# Patient Record
Sex: Male | Born: 1945 | Race: Black or African American | Hispanic: No | Marital: Married | Smoking: Former smoker
Health system: Southern US, Community
[De-identification: ages and names within clinical notes are randomized; demographics above are authoritative.]

## PROBLEM LIST (undated history)

## (undated) DIAGNOSIS — N400 Enlarged prostate without lower urinary tract symptoms: Secondary | ICD-10-CM

## (undated) DIAGNOSIS — I10 Essential (primary) hypertension: Secondary | ICD-10-CM

## (undated) DIAGNOSIS — C801 Malignant (primary) neoplasm, unspecified: Secondary | ICD-10-CM

## (undated) DIAGNOSIS — F17200 Nicotine dependence, unspecified, uncomplicated: Secondary | ICD-10-CM

## (undated) DIAGNOSIS — M1990 Unspecified osteoarthritis, unspecified site: Secondary | ICD-10-CM

## (undated) HISTORY — DX: Benign prostatic hyperplasia without lower urinary tract symptoms: N40.0

## (undated) HISTORY — PX: ORIF ELBOW FRACTURE: SUR928

## (undated) HISTORY — DX: Nicotine dependence, unspecified, uncomplicated: F17.200

## (undated) HISTORY — PX: OTHER SURGICAL HISTORY: SHX169

---

## 2002-05-23 ENCOUNTER — Emergency Department (HOSPITAL_COMMUNITY): Admission: EM | Admit: 2002-05-23 | Discharge: 2002-05-23 | Payer: Self-pay | Admitting: Emergency Medicine

## 2002-05-23 ENCOUNTER — Encounter: Payer: Self-pay | Admitting: Emergency Medicine

## 2003-09-28 ENCOUNTER — Emergency Department (HOSPITAL_COMMUNITY): Admission: EM | Admit: 2003-09-28 | Discharge: 2003-09-28 | Payer: Self-pay | Admitting: *Deleted

## 2010-01-14 ENCOUNTER — Ambulatory Visit (HOSPITAL_COMMUNITY)
Admission: RE | Admit: 2010-01-14 | Discharge: 2010-01-14 | Payer: Self-pay | Source: Home / Self Care | Admitting: Family Medicine

## 2010-03-11 ENCOUNTER — Ambulatory Visit: Admit: 2010-03-11 | Payer: Self-pay | Admitting: Internal Medicine

## 2010-04-25 LAB — COMPREHENSIVE METABOLIC PANEL
Albumin: 4.2 g/dL (ref 3.5–5.2)
Alkaline Phosphatase: 81 U/L (ref 39–117)
BUN: 8 mg/dL (ref 6–23)
Chloride: 106 mEq/L (ref 96–112)
Creatinine, Ser: 1.13 mg/dL (ref 0.4–1.5)
Glucose, Bld: 92 mg/dL (ref 70–99)
Total Bilirubin: 1.2 mg/dL (ref 0.3–1.2)
Total Protein: 7.3 g/dL (ref 6.0–8.3)

## 2010-04-25 LAB — CBC
HCT: 42.7 % (ref 39.0–52.0)
MCH: 27.9 pg (ref 26.0–34.0)
MCV: 84.6 fL (ref 78.0–100.0)
Platelets: 186 10*3/uL (ref 150–400)
RDW: 13.5 % (ref 11.5–15.5)

## 2010-04-25 LAB — LIPID PANEL: VLDL: 49 mg/dL — ABNORMAL HIGH (ref 0–40)

## 2010-04-25 LAB — TSH: TSH: 0.769 u[IU]/mL (ref 0.350–4.500)

## 2010-04-25 LAB — PSA: PSA: 1.77 ng/mL (ref <=4.00)

## 2011-11-17 ENCOUNTER — Ambulatory Visit (HOSPITAL_COMMUNITY)
Admission: RE | Admit: 2011-11-17 | Discharge: 2011-11-17 | Disposition: A | Discharge status: Home / Self Care | Payer: Medicare Other | Source: Ambulatory Visit | Attending: Family Medicine | Admitting: Family Medicine

## 2011-11-17 ENCOUNTER — Other Ambulatory Visit (HOSPITAL_COMMUNITY): Payer: Self-pay | Admitting: Family Medicine

## 2011-11-17 ENCOUNTER — Ambulatory Visit (HOSPITAL_COMMUNITY): Payer: Self-pay

## 2011-11-17 DIAGNOSIS — I498 Other specified cardiac arrhythmias: Secondary | ICD-10-CM | POA: Insufficient documentation

## 2011-11-17 DIAGNOSIS — I10 Essential (primary) hypertension: Secondary | ICD-10-CM | POA: Insufficient documentation

## 2011-12-15 ENCOUNTER — Other Ambulatory Visit: Payer: Self-pay | Admitting: Urology

## 2011-12-19 ENCOUNTER — Encounter (HOSPITAL_BASED_OUTPATIENT_CLINIC_OR_DEPARTMENT_OTHER): Payer: Self-pay | Admitting: *Deleted

## 2011-12-19 NOTE — Progress Notes (Signed)
Pt instructed npo p mn 11/7 .  To [facility] 11/8 @ 1015.  Needs istat on arrival.  Ekg,cxr in epic

## 2011-12-21 NOTE — H&P (Signed)
History of Present Illness  Joseph Hunt is seen at Dr Britt Bottom Blount's request.  He has noticed an increasing swelling of the left groin for the past 6-9 months.  The swelling is not associated with any pain.  He has some scrotal discomfort.  He has frequency, nocturia, difficulty achieving full erections. He also would like to be circumcised.  He has been having difficulty retracting his foreskin for hygiene purposes.  Past Medical History Problems  1. History of  Atrial Fibrillation 427.31 2. History of  Esophageal Reflux 530.81 3. History of  Gastric Ulcer 531.90 4. History of  Hypercholesterolemia 272.0 5. History of  Hypertension 401.9 6. History of  Thrombophlebitis Of Deep Vessels Of The Lower Extremity V12.51 7. History of  Transient Ischemic Attack 435.9  Surgical History Problems  1. History of  Leg Repair  Current Meds 1. AmLODIPine Besylate 10 MG Oral Tablet; Therapy: (Recorded:31Oct2013) to 2. CloNIDine HCl 0.1 MG Oral Tablet; Therapy: (Recorded:31Oct2013) to  Allergies Medication  1. No Known Drug Allergies  Family History Problems  1. Maternal history of  Diabetes Mellitus V18.0 2. Family history of  Family Health Status Number Of Children 3. Family history of  Father Deceased At Age 75 4. Family history of  Mother Deceased At Age 56 5. Paternal history of  Prostate Cancer V16.42 6. Maternal history of  Renal Failure  Social History Problems    Caffeine Use   Marital History - Currently Married   Occupation:   Smoking Cigarettes 305.1 Denied    History of  Alcohol Use  Review of Systems Genitourinary, constitutional, skin, eye, otolaryngeal, hematologic/lymphatic, cardiovascular, pulmonary, endocrine, musculoskeletal, gastrointestinal, neurological and psychiatric system(s) were reviewed and pertinent findings if present are noted.  Genitourinary: urinary frequency, nocturia and erectile dysfunction.  Gastrointestinal: heartburn.  Constitutional:  recent weight loss.  Musculoskeletal: back pain and joint pain.    Vitals Vital Signs [Data Includes: Last 1 Day]  31Oct2013 12:59PM  BMI Calculated: 28.15 BSA Calculated: 2.24 Height: 6 ft 1.5 in Weight: 217 lb  Blood Pressure: 151 / 87 Heart Rate: 71  Physical Exam Constitutional: Well nourished and well developed . No acute distress.  ENT:. The ears and nose are normal in appearance.  Neck: The appearance of the neck is normal and no neck mass is present.  Pulmonary: No respiratory distress and normal respiratory rhythm and effort.  Cardiovascular: Heart rate and rhythm are normal . No peripheral edema.  Abdomen: The abdomen is soft and nontender. No masses are palpated. No CVA tenderness. No hernias are palpable. No hepatosplenomegaly noted.  Rectal: Rectal exam demonstrates normal sphincter tone, no tenderness and no masses. Estimated prostate size is 2+. The prostate has no nodularity and is not tender. The left seminal vesicle is nonpalpable. The right seminal vesicle is nonpalpable. The perineum is normal on inspection.  Genitourinary: Examination of the penis demonstrates no discharge, no masses, no lesions and a normal meatus. The penis is uncircumcised. The scrotum is edematous, but without lesions. Examination of the left scrotum demostrates a hydrocele. The right epididymis is palpably normal and non-tender. The left epididymis is palpably normal and non-tender. The right testis is palpably normal, non-tender and without masses. The left testis is nonpalpable, non-tender and without masses.  Lymphatics: The femoral and inguinal nodes are not enlarged or tender.  Skin: Normal skin turgor, no visible rash and no visible skin lesions.  Neuro/Psych:. Mood and affect are appropriate.    Results/Data Urine [Data Includes: Last 1 Day]  31Oct2013  COLOR YELLOW   APPEARANCE CLEAR   SPECIFIC GRAVITY 1.010   pH 7.0   GLUCOSE NEG mg/dL  BILIRUBIN NEG   KETONE NEG mg/dL  BLOOD  NEG   PROTEIN NEG mg/dL  UROBILINOGEN 0.2 mg/dL  NITRITE NEG   LEUKOCYTE ESTERASE NEG     SCROTAL ULTRASOUND INDICATION: Swelling left scrotum The right testis measures 3.9 x 1.7 x 2.2 cm The left testis measures 3.9 x 2.2 x 2.4 cm. There is a large left hydrocele that measures 10.1 x 8.6 x 6.5 cm. There is good flow to both testes. IMPRESSION: Large left hydrocele.   Assessment Assessed  1. Hydrocele Left 603.9  Plan Health Maintenance (V70.0)  1. UA With REFLEX  Done: 31Oct2013 12:37PM Hydrocele Left (603.9)  2. SCROTAL U/S  Done: 31Oct2013 12:00AM      Scrotal ultrasound. I discussed treatment options with the patient: conservative management versus excision. He states that the swelling has been bothering him and he would like to have it out.  The risks of the procedure were explained to him and include but are not limited to hemorrhage, infection, recurrence, hematoma, injury to adjacent structures. He understands and wishes to proceed.  Circumcision.  Signatures  CC: Dr Clyda Greener  Electronically signed by : Su Grand, M.D.; Dec 14 2011  3:02PM

## 2011-12-22 ENCOUNTER — Ambulatory Visit (HOSPITAL_BASED_OUTPATIENT_CLINIC_OR_DEPARTMENT_OTHER)
Admission: RE | Admit: 2011-12-22 | Discharge: 2011-12-22 | Disposition: A | Discharge status: Home / Self Care | Payer: Medicare Other | Source: Ambulatory Visit | Attending: Urology | Admitting: Urology

## 2011-12-22 ENCOUNTER — Encounter (HOSPITAL_BASED_OUTPATIENT_CLINIC_OR_DEPARTMENT_OTHER): Payer: Self-pay

## 2011-12-22 ENCOUNTER — Encounter (HOSPITAL_BASED_OUTPATIENT_CLINIC_OR_DEPARTMENT_OTHER): Payer: Self-pay | Admitting: Anesthesiology

## 2011-12-22 ENCOUNTER — Ambulatory Visit (HOSPITAL_BASED_OUTPATIENT_CLINIC_OR_DEPARTMENT_OTHER): Payer: Medicare Other | Admitting: Anesthesiology

## 2011-12-22 ENCOUNTER — Encounter (HOSPITAL_BASED_OUTPATIENT_CLINIC_OR_DEPARTMENT_OTHER)
Admission: RE | Disposition: A | Discharge status: Home / Self Care | Payer: Self-pay | Source: Ambulatory Visit | Attending: Urology

## 2011-12-22 DIAGNOSIS — Z8673 Personal history of transient ischemic attack (TIA), and cerebral infarction without residual deficits: Secondary | ICD-10-CM | POA: Insufficient documentation

## 2011-12-22 DIAGNOSIS — N478 Other disorders of prepuce: Secondary | ICD-10-CM | POA: Insufficient documentation

## 2011-12-22 DIAGNOSIS — I10 Essential (primary) hypertension: Secondary | ICD-10-CM | POA: Insufficient documentation

## 2011-12-22 DIAGNOSIS — N433 Hydrocele, unspecified: Secondary | ICD-10-CM | POA: Insufficient documentation

## 2011-12-22 DIAGNOSIS — K219 Gastro-esophageal reflux disease without esophagitis: Secondary | ICD-10-CM | POA: Insufficient documentation

## 2011-12-22 DIAGNOSIS — E7800 Pure hypercholesterolemia, unspecified: Secondary | ICD-10-CM | POA: Insufficient documentation

## 2011-12-22 DIAGNOSIS — N471 Phimosis: Secondary | ICD-10-CM | POA: Insufficient documentation

## 2011-12-22 HISTORY — DX: Essential (primary) hypertension: I10

## 2011-12-22 HISTORY — DX: Unspecified osteoarthritis, unspecified site: M19.90

## 2011-12-22 HISTORY — PX: CIRCUMCISION: SHX1350

## 2011-12-22 HISTORY — PX: HYDROCELE EXCISION: SHX482

## 2011-12-22 LAB — POCT I-STAT 4, (NA,K, GLUC, HGB,HCT)
Hemoglobin: 14.6 g/dL (ref 13.0–17.0)
Sodium: 143 mEq/L (ref 135–145)

## 2011-12-22 SURGERY — HYDROCELECTOMY
Anesthesia: General | Site: Scrotum | Wound class: Clean

## 2011-12-22 MED ORDER — CEFAZOLIN SODIUM-DEXTROSE 2-3 GM-% IV SOLR
2.0000 g | INTRAVENOUS | Status: AC
  Administered 2011-12-22: 2 g via INTRAVENOUS
  Filled 2011-12-22: qty 50

## 2011-12-22 MED ORDER — LIDOCAINE HCL 4 % MT SOLN
OROMUCOSAL | Status: DC | PRN
  Administered 2011-12-22: 4 mL via TOPICAL

## 2011-12-22 MED ORDER — HYDROCODONE-ACETAMINOPHEN 5-500 MG PO CAPS: 1.0000 | ORAL_CAPSULE | Freq: Four times a day (QID) | ORAL | Status: DC | PRN

## 2011-12-22 MED ORDER — EPHEDRINE SULFATE 50 MG/ML IJ SOLN
INTRAMUSCULAR | Status: DC | PRN
  Administered 2011-12-22: 25 mg via INTRAVENOUS

## 2011-12-22 MED ORDER — ONDANSETRON HCL 4 MG/2ML IJ SOLN
INTRAMUSCULAR | Status: DC | PRN
  Administered 2011-12-22: 4 mg via INTRAVENOUS

## 2011-12-22 MED ORDER — LIDOCAINE HCL (CARDIAC) 20 MG/ML IV SOLN
INTRAVENOUS | Status: DC | PRN
  Administered 2011-12-22: 100 mg via INTRAVENOUS

## 2011-12-22 MED ORDER — MINERAL OIL LIGHT 100 % EX OIL: TOPICAL_OIL | CUTANEOUS | Status: DC | PRN

## 2011-12-22 MED ORDER — HYDROCODONE-ACETAMINOPHEN 5-325 MG PO TABS
1.0000 | ORAL_TABLET | Freq: Four times a day (QID) | ORAL | Status: DC | PRN
  Filled 2011-12-22: qty 1

## 2011-12-22 MED ORDER — LACTATED RINGERS IV SOLN
INTRAVENOUS | Status: DC
  Administered 2011-12-22 (×2): via INTRAVENOUS
  Filled 2011-12-22: qty 1000

## 2011-12-22 MED ORDER — MIDAZOLAM HCL 5 MG/5ML IJ SOLN
INTRAMUSCULAR | Status: DC | PRN
  Administered 2011-12-22: 1 mg via INTRAVENOUS

## 2011-12-22 MED ORDER — SUCCINYLCHOLINE CHLORIDE 20 MG/ML IJ SOLN
INTRAMUSCULAR | Status: DC | PRN
  Administered 2011-12-22: 140 mg via INTRAVENOUS

## 2011-12-22 MED ORDER — PROMETHAZINE HCL 25 MG/ML IJ SOLN
6.2500 mg | INTRAMUSCULAR | Status: DC | PRN
  Filled 2011-12-22: qty 1

## 2011-12-22 MED ORDER — COLLODION LIQD
Status: DC | PRN
  Administered 2011-12-22: 1 via TOPICAL

## 2011-12-22 MED ORDER — CEPHALEXIN 250 MG PO CAPS: 250.0000 mg | ORAL_CAPSULE | Freq: Four times a day (QID) | ORAL | Status: DC

## 2011-12-22 MED ORDER — BUPIVACAINE HCL (PF) 0.25 % IJ SOLN
INTRAMUSCULAR | Status: DC | PRN
  Administered 2011-12-22: 17 mL

## 2011-12-22 MED ORDER — BACITRACIN-NEOMYCIN-POLYMYXIN 400-5-5000 EX OINT
TOPICAL_OINTMENT | CUTANEOUS | Status: DC | PRN
  Administered 2011-12-22: 1 via TOPICAL

## 2011-12-22 MED ORDER — GLYCOPYRROLATE 0.2 MG/ML IJ SOLN
INTRAMUSCULAR | Status: DC | PRN
  Administered 2011-12-22: 0.2 mg via INTRAVENOUS

## 2011-12-22 MED ORDER — HYDROCODONE-ACETAMINOPHEN 5-325 MG PO TABS
1.0000 | ORAL_TABLET | Freq: Four times a day (QID) | ORAL | Status: DC | PRN
  Administered 2011-12-22: 1 via ORAL
  Filled 2011-12-22: qty 1

## 2011-12-22 MED ORDER — CEFAZOLIN SODIUM 1-5 GM-% IV SOLN
1.0000 g | INTRAVENOUS | Status: DC
  Filled 2011-12-22: qty 50

## 2011-12-22 MED ORDER — FENTANYL CITRATE 0.05 MG/ML IJ SOLN
25.0000 ug | INTRAMUSCULAR | Status: DC | PRN
  Filled 2011-12-22: qty 1

## 2011-12-22 MED ORDER — FENTANYL CITRATE 0.05 MG/ML IJ SOLN
INTRAMUSCULAR | Status: DC | PRN
  Administered 2011-12-22 (×2): 50 ug via INTRAVENOUS

## 2011-12-22 MED ORDER — DEXAMETHASONE SODIUM PHOSPHATE 4 MG/ML IJ SOLN
INTRAMUSCULAR | Status: DC | PRN
  Administered 2011-12-22: 8 mg via INTRAVENOUS

## 2011-12-22 MED ORDER — PROPOFOL 10 MG/ML IV BOLUS
INTRAVENOUS | Status: DC | PRN
  Administered 2011-12-22: 80 mg via INTRAVENOUS
  Administered 2011-12-22: 200 mg via INTRAVENOUS

## 2011-12-22 SURGICAL SUPPLY — 55 items
APL SKNCLS STERI-STRIP NONHPOA (GAUZE/BANDAGES/DRESSINGS)
APPLICATOR COTTON TIP 6IN STRL (MISCELLANEOUS) ×3 IMPLANT
BANDAGE CO FLEX L/F 1IN X 5YD (GAUZE/BANDAGES/DRESSINGS) IMPLANT
BANDAGE COBAN STERILE 2 (GAUZE/BANDAGES/DRESSINGS) ×1 IMPLANT
BANDAGE CONFORM 2  STR LF (GAUZE/BANDAGES/DRESSINGS) ×1 IMPLANT
BANDAGE GAUZE ELAST BULKY 4 IN (GAUZE/BANDAGES/DRESSINGS) ×3 IMPLANT
BENZOIN TINCTURE PRP APPL 2/3 (GAUZE/BANDAGES/DRESSINGS) IMPLANT
BLADE SURG 15 STRL LF DISP TIS (BLADE) ×2 IMPLANT
BLADE SURG 15 STRL SS (BLADE) ×3
BLADE SURG ROTATE 9660 (MISCELLANEOUS) ×3 IMPLANT
CANISTER SUCTION 1200CC (MISCELLANEOUS) IMPLANT
CANISTER SUCTION 2500CC (MISCELLANEOUS) ×1 IMPLANT
CLOTH BEACON ORANGE TIMEOUT ST (SAFETY) ×3 IMPLANT
COVER MAYO STAND STRL (DRAPES) ×3 IMPLANT
COVER TABLE BACK 60X90 (DRAPES) ×3 IMPLANT
DRAPE PED LAPAROTOMY (DRAPES) ×3 IMPLANT
ELECT NDL TIP 2.8 STRL (NEEDLE) ×2 IMPLANT
ELECT NEEDLE TIP 2.8 STRL (NEEDLE) ×3 IMPLANT
ELECT REM PT RETURN 9FT ADLT (ELECTROSURGICAL) ×3
ELECTRODE REM PT RTRN 9FT ADLT (ELECTROSURGICAL) ×2 IMPLANT
GAUZE VASELINE 1X8 (GAUZE/BANDAGES/DRESSINGS) ×1 IMPLANT
GLOVE BIO SURGEON STRL SZ 6.5 (GLOVE) ×1 IMPLANT
GLOVE BIO SURGEON STRL SZ7 (GLOVE) ×3 IMPLANT
GLOVE BIO SURGEON STRL SZ7.5 (GLOVE) ×1 IMPLANT
GLOVE BIOGEL PI IND STRL 7.0 (GLOVE) IMPLANT
GLOVE BIOGEL PI INDICATOR 7.0 (GLOVE) ×1
GLOVE ECLIPSE 7.0 STRL STRAW (GLOVE) ×1 IMPLANT
GOWN PREVENTION PLUS LG XLONG (DISPOSABLE) ×2 IMPLANT
GOWN PREVENTION PLUS XLARGE (GOWN DISPOSABLE) ×1 IMPLANT
GOWN STRL NON-REIN LRG LVL3 (GOWN DISPOSABLE) ×1 IMPLANT
GOWN W/2 COTTON TOWELS 2 STD (GOWNS) ×2 IMPLANT
MARKER SKIN DUAL TIP RULER LAB (MISCELLANEOUS) ×3 IMPLANT
NDL HYPO 25X1 1.5 SAFETY (NEEDLE) ×2 IMPLANT
NEEDLE HYPO 25X1 1.5 SAFETY (NEEDLE) ×3 IMPLANT
NS IRRIG 500ML POUR BTL (IV SOLUTION) ×3 IMPLANT
PACK BASIN DAY SURGERY FS (CUSTOM PROCEDURE TRAY) ×3 IMPLANT
PAD PREP 24X48 CUFFED NSTRL (MISCELLANEOUS) ×3 IMPLANT
PENCIL BUTTON HOLSTER BLD 10FT (ELECTRODE) ×3 IMPLANT
SPONGE GAUZE 4X4 12PLY (GAUZE/BANDAGES/DRESSINGS) ×1 IMPLANT
STRIP CLOSURE SKIN 1/2X4 (GAUZE/BANDAGES/DRESSINGS) IMPLANT
SUPPORT SCROTAL LG STRP (MISCELLANEOUS) ×3 IMPLANT
SUT CHROMIC 3 0 PS 2 (SUTURE) ×3 IMPLANT
SUT CHROMIC 3 0 SH 27 (SUTURE) ×6 IMPLANT
SUT CHROMIC 4 0 P 3 18 (SUTURE) ×2 IMPLANT
SUT CHROMIC 5 0 RB 1 27 (SUTURE) IMPLANT
SUT MON AB 4-0 PC3 18 (SUTURE) IMPLANT
SUT SILK 2 0 SH (SUTURE) ×1 IMPLANT
SUT VIC AB 3-0 SH 27 (SUTURE)
SUT VIC AB 3-0 SH 27X BRD (SUTURE) IMPLANT
SUT VICRYL 0 TIES 12 18 (SUTURE) IMPLANT
SYR CONTROL 10ML LL (SYRINGE) ×3 IMPLANT
TOWEL OR 17X24 6PK STRL BLUE (TOWEL DISPOSABLE) ×6 IMPLANT
TRAY DSU PREP LF (CUSTOM PROCEDURE TRAY) ×3 IMPLANT
WATER STERILE IRR 500ML POUR (IV SOLUTION) ×2 IMPLANT
YANKAUER SUCT BULB TIP NO VENT (SUCTIONS) ×3 IMPLANT

## 2011-12-22 NOTE — Anesthesia Preprocedure Evaluation (Addendum)
Anesthesia Evaluation  Patient identified by MRN, date of birth, ID band Patient awake    Reviewed: Allergy & Precautions, H&P , NPO status , Patient's Chart, lab work & pertinent test results  Airway Mallampati: II TM Distance: >3 FB Neck ROM: Full    Dental No notable dental hx.    Pulmonary neg pulmonary ROS,  breath sounds clear to auscultation  Pulmonary exam normal       Cardiovascular hypertension, Pt. on medications + dysrhythmias Atrial Fibrillation Rhythm:Regular Rate:Normal  Chart says h/o a. Fib., but patient unaware that he ever had this problem.  ECG: 11/17/11: NSR, nonspecific ST and TWA.   Neuro/Psych TIAnegative psych ROS   GI/Hepatic Neg liver ROS, GERD-  ,  Endo/Other  negative endocrine ROS  Renal/GU negative Renal ROS  negative genitourinary   Musculoskeletal negative musculoskeletal ROS (+)   Abdominal   Peds negative pediatric ROS (+)  Hematology negative hematology ROS (+)   Anesthesia Other Findings   Reproductive/Obstetrics negative OB ROS                        Anesthesia Physical Anesthesia Plan  ASA: III  Anesthesia Plan: General   Post-op Pain Management:    Induction: Intravenous  Airway Management Planned: LMA  Additional Equipment:   Intra-op Plan:   Post-operative Plan: Extubation in OR  Informed Consent: I have reviewed the patients History and Physical, chart, labs and discussed the procedure including the risks, benefits and alternatives for the proposed anesthesia with the patient or authorized representative who has indicated his/her understanding and acceptance.   Dental advisory given  Plan Discussed with: CRNA  Anesthesia Plan Comments:         Anesthesia Quick Evaluation

## 2011-12-22 NOTE — Progress Notes (Signed)
Patient ready to leave at 1600  Waiting for wife to return.to pick up patient

## 2011-12-22 NOTE — Op Note (Signed)
Joseph Hunt is a 66 y.o.   12/22/2011  General  Pre-op diagnosis: Left hydrocele, phimosis  Postop diagnosis: Same  Procedure done: Left hydrocelectomy, circumcision  Surgeon: Wendie Simmer. [name]  Anesthesia: General  Indication: Patient is a 66 years old male who has had increasing swelling of the left scrotum for several years. The swelling is associated with discomfort. On physical examination he was found to have a left hydrocele that was confirmed by ultrasound. He also has been complaining of difficulty retracting his foreskin for hygiene purposes and also irritation of the foreskin. He would like to be circumcised. Treatment options of the hydrocele were discussed with him. They are: conservative management versus hydrocelectomy. He elected to have hydrocelectomy. He is scheduled today for the procedure and circumcision  Procedure: Patient was identified by his wrist band and proper timeout was taken.  Under general anesthesia he was prepped and draped and placed in the supine position. The scrotum was infiltrated with 0.25% Marcaine. Then a longitudinal incision was made on the scrotum. The incision was carried down to the tunica vaginalis. Hemostasis was secured with electrocautery. The tunica vaginalis was then incised. About 400 cc of yellowish fluid were drained out of the hydrocele sac. Excision of the appendix testis was done. Then using the Lord's technique the tunica vaginalis was imbricated with #3-0 chromic. The wound was then irrigated with normal saline and the testicle was replaced in the scrotum. The scrotum was then closed in 2 layers as follows: the subcutaneous tissues were closed with #3-0 Vicryl and the skin was closed with #3-0 chromic.  Then a penile block was done with 0.25% Marcaine. 2 circumferential incisions were then made on the foreskin and the foreskin in between those 2 incisions was excised. Hemostasis was secured with electrocautery. Skin approximation was  done with #4-0 chromic.  EBL: Minimal  Needles, sponges count: Correct  Patient tolerated the procedure well and left the OR in satisfactory condition to postanesthesia care unit.

## 2011-12-22 NOTE — Transfer of Care (Signed)
Immediate Anesthesia Transfer of Care Note  Patient: Joseph Hunt  Procedure(s) Performed: Procedure(s) (LRB): HYDROCELECTOMY ADULT (Left) CIRCUMCISION ADULT (N/A)  Patient Location: PACU  Anesthesia Type: General  Level of Consciousness: drowsy, arouses to name, follows commands  Airway & Oxygen Therapy: Patient Spontanous Breathing and Patient connected to face mask oxygen  Post-op Assessment: Report given to PACU RN and Post -op Vital signs reviewed and stable  Post vital signs: Reviewed and stable  Complications: No apparent anesthesia complications

## 2011-12-22 NOTE — Anesthesia Procedure Notes (Signed)
Procedure Name: Intubation Date/Time: 12/22/2011 12:15 PM Performed by: Norva Pavlov Pre-anesthesia Checklist: Patient identified, Timeout performed, Emergency Drugs available, Suction available and Patient being monitored Patient Re-evaluated:Patient Re-evaluated prior to inductionOxygen Delivery Method: Circle System Utilized Preoxygenation: Pre-oxygenation with 100% oxygen Intubation Type: IV induction Ventilation: Mask ventilation without difficulty Laryngoscope Size: Mac and 4 Grade View: Grade I Tube type: Oral Tube size: 8.0 mm Number of attempts: 1 Airway Equipment and Method: stylet and LTA kit utilized Placement Confirmation: ETT inserted through vocal cords under direct vision,  positive ETCO2 and breath sounds checked- equal and bilateral Secured at: 22 cm Tube secured with: Tape Dental Injury: Teeth and Oropharynx as per pre-operative assessment

## 2011-12-23 NOTE — Anesthesia Postprocedure Evaluation (Signed)
  Anesthesia Post-op Note  Patient: Joseph Hunt  Procedure(s) Performed: Procedure(s) (LRB): HYDROCELECTOMY ADULT (Left) CIRCUMCISION ADULT (N/A)  Patient Location: PACU  Anesthesia Type: General  Level of Consciousness: awake and alert   Airway and Oxygen Therapy: Patient Spontanous Breathing  Post-op Pain: mild  Post-op Assessment: Post-op Vital signs reviewed, Patient's Cardiovascular Status Stable, Respiratory Function Stable, Patent Airway and No signs of Nausea or vomiting  Post-op Vital Signs: stable  Complications: No apparent anesthesia complications

## 2011-12-26 ENCOUNTER — Encounter (HOSPITAL_BASED_OUTPATIENT_CLINIC_OR_DEPARTMENT_OTHER): Payer: Self-pay | Admitting: Urology

## 2014-11-04 ENCOUNTER — Other Ambulatory Visit (HOSPITAL_COMMUNITY): Payer: Self-pay | Admitting: Family Medicine

## 2014-11-04 DIAGNOSIS — R000 Tachycardia, unspecified: Secondary | ICD-10-CM

## 2014-11-06 ENCOUNTER — Other Ambulatory Visit (HOSPITAL_COMMUNITY): Payer: Medicare Other

## 2014-11-06 ENCOUNTER — Ambulatory Visit (HOSPITAL_COMMUNITY)
Admission: RE | Admit: 2014-11-06 | Discharge: 2014-11-06 | Disposition: A | Discharge status: Home / Self Care | Payer: Medicare Other | Source: Ambulatory Visit | Attending: Family Medicine | Admitting: Family Medicine

## 2014-11-06 ENCOUNTER — Other Ambulatory Visit (HOSPITAL_COMMUNITY): Payer: Self-pay | Admitting: Family Medicine

## 2014-11-06 DIAGNOSIS — I517 Cardiomegaly: Secondary | ICD-10-CM | POA: Insufficient documentation

## 2014-11-06 DIAGNOSIS — F17200 Nicotine dependence, unspecified, uncomplicated: Secondary | ICD-10-CM | POA: Diagnosis not present

## 2014-11-06 DIAGNOSIS — R05 Cough: Secondary | ICD-10-CM | POA: Insufficient documentation

## 2014-11-06 DIAGNOSIS — R059 Cough, unspecified: Secondary | ICD-10-CM

## 2014-11-06 LAB — CBC
HCT: 42.5 % (ref 39.0–52.0)
Hemoglobin: 13.9 g/dL (ref 13.0–17.0)
MCH: 28 pg (ref 26.0–34.0)
MCHC: 32.7 g/dL (ref 30.0–36.0)
MCV: 85.5 fL (ref 78.0–100.0)
PLATELETS: 162 10*3/uL (ref 150–400)
RBC: 4.97 MIL/uL (ref 4.22–5.81)
RDW: 13.4 % (ref 11.5–15.5)
WBC: 6.3 10*3/uL (ref 4.0–10.5)

## 2014-11-06 LAB — COMPREHENSIVE METABOLIC PANEL
ALBUMIN: 4.2 g/dL (ref 3.5–5.0)
ALT: 11 U/L — AB (ref 17–63)
AST: 17 U/L (ref 15–41)
Alkaline Phosphatase: 83 U/L (ref 38–126)
Anion gap: 5 (ref 5–15)
BUN: 6 mg/dL (ref 6–20)
CHLORIDE: 107 mmol/L (ref 101–111)
CO2: 31 mmol/L (ref 22–32)
CREATININE: 1.08 mg/dL (ref 0.61–1.24)
Calcium: 9.8 mg/dL (ref 8.9–10.3)
GFR calc Af Amer: >60 mL/min (ref >60)
GFR calc non Af Amer: >60 mL/min (ref >60)
GLUCOSE: 100 mg/dL — AB (ref 65–99)
Potassium: 4.3 mmol/L (ref 3.5–5.1)
SODIUM: 143 mmol/L (ref 135–145)
Total Bilirubin: 0.8 mg/dL (ref 0.3–1.2)
Total Protein: 7.2 g/dL (ref 6.5–8.1)

## 2014-11-06 LAB — LIPID PANEL
Cholesterol: 179 mg/dL (ref 0–200)
HDL: 46 mg/dL (ref >40)
LDL CALC: 110 mg/dL — AB (ref 0–99)
Total CHOL/HDL Ratio: 3.9 RATIO
Triglycerides: 114 mg/dL (ref <150)
VLDL: 23 mg/dL (ref 0–40)

## 2014-11-06 LAB — PSA: PSA: 3 ng/mL (ref 0.00–4.00)

## 2014-11-06 LAB — TSH: TSH: 1.029 u[IU]/mL (ref 0.350–4.500)

## 2014-11-07 LAB — MICROALBUMIN, URINE: MICROALB UR: 5.3 ug/mL — AB

## 2016-12-21 ENCOUNTER — Ambulatory Visit (INDEPENDENT_AMBULATORY_CARE_PROVIDER_SITE_OTHER)
Admission: RE | Admit: 2016-12-21 | Discharge: 2016-12-21 | Disposition: A | Discharge status: Home / Self Care | Payer: Medicare Other | Source: Ambulatory Visit | Attending: Internal Medicine | Admitting: Internal Medicine

## 2016-12-21 ENCOUNTER — Encounter: Payer: Self-pay | Admitting: Internal Medicine

## 2016-12-21 ENCOUNTER — Ambulatory Visit: Payer: Medicare Other | Admitting: Internal Medicine

## 2016-12-21 VITALS — BP 140/88 | HR 68 | Temp 98.3°F | Ht 73.5 in | Wt 202.0 lb

## 2016-12-21 DIAGNOSIS — N400 Enlarged prostate without lower urinary tract symptoms: Secondary | ICD-10-CM | POA: Diagnosis not present

## 2016-12-21 DIAGNOSIS — Z1159 Encounter for screening for other viral diseases: Secondary | ICD-10-CM | POA: Diagnosis not present

## 2016-12-21 DIAGNOSIS — Z0001 Encounter for general adult medical examination with abnormal findings: Secondary | ICD-10-CM | POA: Diagnosis not present

## 2016-12-21 DIAGNOSIS — M25551 Pain in right hip: Secondary | ICD-10-CM

## 2016-12-21 DIAGNOSIS — M1990 Unspecified osteoarthritis, unspecified site: Secondary | ICD-10-CM | POA: Insufficient documentation

## 2016-12-21 DIAGNOSIS — E785 Hyperlipidemia, unspecified: Secondary | ICD-10-CM

## 2016-12-21 DIAGNOSIS — I10 Essential (primary) hypertension: Secondary | ICD-10-CM

## 2016-12-21 DIAGNOSIS — F17200 Nicotine dependence, unspecified, uncomplicated: Secondary | ICD-10-CM | POA: Insufficient documentation

## 2016-12-21 DIAGNOSIS — Z0000 Encounter for general adult medical examination without abnormal findings: Secondary | ICD-10-CM

## 2016-12-21 DIAGNOSIS — Z23 Encounter for immunization: Secondary | ICD-10-CM | POA: Diagnosis not present

## 2016-12-21 HISTORY — DX: Benign prostatic hyperplasia without lower urinary tract symptoms: N40.0

## 2016-12-21 MED ORDER — VARENICLINE TARTRATE 0.5 MG X 11 & 1 MG X 42 PO MISC: ORAL | 0 refills | Status: DC

## 2016-12-21 MED ORDER — AMLODIPINE BESYLATE 5 MG PO TABS: 5.0000 mg | ORAL_TABLET | Freq: Every day | ORAL | 3 refills | Status: AC

## 2016-12-21 MED ORDER — ASPIRIN EC 81 MG PO TBEC: 81.0000 mg | DELAYED_RELEASE_TABLET | Freq: Every day | ORAL | 11 refills | Status: AC

## 2016-12-21 MED ORDER — VARENICLINE TARTRATE 1 MG PO TABS: 1.0000 mg | ORAL_TABLET | Freq: Two times a day (BID) | ORAL | 1 refills | Status: DC

## 2016-12-21 NOTE — Assessment & Plan Note (Addendum)
D/w pt, I think more likely hip bursitis, will check films, but will consider sport med in this office if negative  In addition to the time spent performing CPE, I spent an additional 25 minutes face to face,in which greater than 50% of this time was spent in counseling and coordination of care for patient's acute illness as documented., including the differential dx, tx, further evaluation and other management of right hip pain, HTN, smoker, HLD and BPH

## 2016-12-21 NOTE — Assessment & Plan Note (Signed)
Stable, no new tx needed, for labs today and forward to Dr Benjamin/urology

## 2016-12-21 NOTE — Assessment & Plan Note (Signed)
With recent wt loss in last few yrs, BP mild elevated only, will restart tx with amlodipine 5 qd, check Bp at home and next visit

## 2016-12-21 NOTE — Assessment & Plan Note (Signed)

## 2016-12-21 NOTE — Progress Notes (Signed)
Subjective:    Patient ID: Joseph Hunt, male    DOB: 1945/10/16, 71 y.o.   MRN: [id number]  HPI Here for wellness and f/u;  Overall doing ok;  Pt denies Chest pain, worsening SOB, DOE, wheezing, orthopnea, PND, worsening LE edema, palpitations, dizziness or syncope.  Pt denies neurological change such as new headache, facial or extremity weakness.  Pt denies polydipsia, polyuria, or low sugar symptoms. Pt states overall good compliance with treatment and medications, good tolerability, and has been trying to follow appropriate diet.  Pt denies worsening depressive symptoms, suicidal ideation or panic. No fever, night sweats, wt loss, loss of appetite, or other constitutional symptoms.  Pt states good ability with ADL's, has low fall risk, home safety reviewed and adequate, no other significant changes in hearing or vision, and occasionally active with exercise.  Has several friends who are patients of mine.   BP Readings from Last 3 Encounters:  12/21/16 140/88  12/22/11 (!) 143/80   Wt Readings from Last 3 Encounters:  12/21/16 202 lb (91.6 kg)  12/22/11 210 lb (95.3 kg)  Has lost significant wt down from about 240 a few yrs ago, used to be on pravastatin 20, clonidine 0.1, norvasc 10 and fluid pill per prior PCP - Dr Kennon Holter.  Due for prevnar, and colonoscopy, and declines Tdap.  S/p recent flu shot.  Sees Dr Marland Kitchen urology, last visit maybe 6 mo, has f/u appt next month, for BPH.   Stilsl smoking ans asks for chantix to help, has not tried previously.  Also c/o 2 wks intermittent right lateral hip pain, mild to mod, with radation to the knee, worse to walk but no giveaways or falls.  Has recurrring minor midline lumbar low pain but does not think the 2 are related.  Is s/p fx with pin and rod in place near the pain, wonders if this could be related.  Pain worse to lie on right side as well.  No trauma or prior hx of same Past Medical History:  Diagnosis Date  . Arthritis   . BPH (benign  prostatic hyperplasia) 12/21/2016  . Hypertension   . Smoker    Past Surgical History:  Procedure Laterality Date  . ORIF ELBOW FRACTURE    . rt hip pinning      reports that he has been smoking cigarettes.  He has been smoking about 0.25 packs per day. he has never used smokeless tobacco. He reports that he does not drink alcohol. His drug history is not on file. family history is not on file. No Known Allergies Current Outpatient Medications on File Prior to Visit  Medication Sig Dispense Refill  . beta carotene w/minerals (OCUVITE) tablet Take 1 tablet by mouth daily.    . naproxen sodium (ANAPROX) 220 MG tablet Take 220 mg by mouth as needed.     No current facility-administered medications on file prior to visit.    Review of Systems Constitutional: Negative for other unusual diaphoresis, sweats, appetite or weight changes HENT: Negative for other worsening hearing loss, ear pain, facial swelling, mouth sores or neck stiffness.   Eyes: Negative for other worsening pain, redness or other visual disturbance.  Respiratory: Negative for other stridor or swelling Cardiovascular: Negative for other palpitations or other chest pain  Gastrointestinal: Negative for worsening diarrhea or loose stools, blood in stool, distention or other pain Genitourinary: Negative for hematuria, flank pain or other change in urine volume.  Musculoskeletal: Negative for myalgias or other joint swelling.  Skin:  Negative for other color change, or other wound or worsening drainage.  Neurological: Negative for other syncope or numbness. Hematological: Negative for other adenopathy or swelling Psychiatric/Behavioral: Negative for hallucinations, other worsening agitation, SI, self-injury, or new decreased concentration All other system neg per pt    Objective:   Physical Exam BP 140/88   Pulse 68   Temp 98.3 F (36.8 C) (Oral)   Ht 6' 1.5" (1.867 m)   Wt 202 lb (91.6 kg)   SpO2 100%   BMI 26.29 kg/m   VS noted,  Constitutional: Pt is oriented to person, place, and time. Appears well-developed and well-nourished, in no significant distress and comfortable Head: Normocephalic and atraumatic  Eyes: Conjunctivae and EOM are normal. Pupils are equal, round, and reactive to light Right Ear: External ear normal without discharge Left Ear: External ear normal without discharge Nose: Nose without discharge or deformity Mouth/Throat: Oropharynx is without other ulcerations and moist  Neck: Normal range of motion. Neck supple. No JVD present. No tracheal deviation present or significant neck LA or mass Cardiovascular: Normal rate, regular rhythm, normal heart sounds and intact distal pulses.   Pulmonary/Chest: WOB normal and breath sounds without rales or wheezing  Abdominal: Soft. Bowel sounds are normal. NT. No HSM  Musculoskeletal: Normal range of motion. Exhibits no edema, does have some tender to right lateral hip over the greater trochanter Lymphadenopathy: Has no other cervical adenopathy.  Neurological: Pt is alert and oriented to person, place, and time. Pt has normal reflexes. No cranial nerve deficit. Motor grossly intact, Gait intact Skin: Skin is warm and dry. No rash noted or new ulcerations Psychiatric:  Has normal mood and affect. Behavior is normal without agitation No other exam findings Lab Results  Component Value Date   WBC 6.3 11/06/2014   HGB 13.9 11/06/2014   HCT 42.5 11/06/2014   PLT 162 11/06/2014   GLUCOSE 100 (H) 11/06/2014   CHOL 179 11/06/2014   TRIG 114 11/06/2014   HDL 46 11/06/2014   LDLCALC 110 (H) 11/06/2014   ALT 11 (L) 11/06/2014   AST 17 11/06/2014   NA 143 11/06/2014   K 4.3 11/06/2014   CL 107 11/06/2014   CREATININE 1.08 11/06/2014   BUN 6 11/06/2014   CO2 31 11/06/2014   TSH 1.029 11/06/2014   PSA 3.00 11/06/2014   MICROALBUR 5.3 (H) 11/06/2014       Assessment & Plan:

## 2016-12-21 NOTE — Assessment & Plan Note (Signed)
For lab today

## 2016-12-21 NOTE — Patient Instructions (Addendum)
You had the Prevnar 13 pneumonia shot today  You will be contacted regarding the referral for: colonoscopy  Please start aspirin 81 mg - 1 per day (coated only) to help prevent strokes and heart disease  Please take all new medication as prescribed - the Chantix for smoking cessation, and amlodipine 5 mg for the blood pressure (we can hold on the prior clonidine and fluid pill for now)  Please continue all other medications as before, and refills have been done if requested.  Please have the pharmacy call with any other refills you may need.  Please continue your efforts at being more active, low cholesterol diet, and weight control.  You are otherwise up to date with prevention measures today.  Please keep your appointments with your specialists as you may have planned  Please go to the XRAY Department in the Basement (go straight as you get off the elevator) for the x-ray testing  Please go to the LAB in the Basement (turn left off the elevator) for the tests to be done today  You will be contacted by phone if any changes need to be made immediately.  Otherwise, you will receive a letter about your results with an explanation, but please check with MyChart first.  Please remember to sign up for MyChart if you have not done so, as this will be important to you in the future with finding out test results, communicating by private email, and scheduling acute appointments online when needed.  Please return in 1 year for your yearly visit, or sooner if needed, with Lab testing done 3-5 days before

## 2016-12-21 NOTE — Assessment & Plan Note (Signed)
stable overall by history and exam, recent data reviewed with pt, and pt to continue medical treatment as before,  to f/u any worsening symptoms or concerns Lab Results  Component Value Date   LDLCALC 110 (H) 11/06/2014  to consider restart pravastatin 20 after lab today

## 2016-12-21 NOTE — Assessment & Plan Note (Signed)
Ok for chantix asd,  to f/u any worsening symptoms or concerns 

## 2017-03-23 ENCOUNTER — Encounter: Payer: Self-pay | Admitting: Internal Medicine

## 2017-06-21 ENCOUNTER — Other Ambulatory Visit: Payer: Self-pay

## 2017-06-21 ENCOUNTER — Emergency Department (HOSPITAL_COMMUNITY): Payer: Medicare Other

## 2017-06-21 ENCOUNTER — Encounter (HOSPITAL_COMMUNITY): Payer: Self-pay | Admitting: Emergency Medicine

## 2017-06-21 ENCOUNTER — Emergency Department (HOSPITAL_COMMUNITY)
Admission: EM | Admit: 2017-06-21 | Discharge: 2017-06-21 | Disposition: A | Discharge status: Home / Self Care | Payer: Medicare Other | Attending: Emergency Medicine | Admitting: Emergency Medicine

## 2017-06-21 DIAGNOSIS — R1084 Generalized abdominal pain: Secondary | ICD-10-CM

## 2017-06-21 DIAGNOSIS — K769 Liver disease, unspecified: Secondary | ICD-10-CM

## 2017-06-21 DIAGNOSIS — Z7982 Long term (current) use of aspirin: Secondary | ICD-10-CM | POA: Diagnosis not present

## 2017-06-21 DIAGNOSIS — M25551 Pain in right hip: Secondary | ICD-10-CM | POA: Diagnosis not present

## 2017-06-21 DIAGNOSIS — K7689 Other specified diseases of liver: Secondary | ICD-10-CM | POA: Insufficient documentation

## 2017-06-21 DIAGNOSIS — I10 Essential (primary) hypertension: Secondary | ICD-10-CM | POA: Insufficient documentation

## 2017-06-21 DIAGNOSIS — Z79899 Other long term (current) drug therapy: Secondary | ICD-10-CM | POA: Diagnosis not present

## 2017-06-21 DIAGNOSIS — F17210 Nicotine dependence, cigarettes, uncomplicated: Secondary | ICD-10-CM | POA: Insufficient documentation

## 2017-06-21 LAB — COMPREHENSIVE METABOLIC PANEL
ALBUMIN: 3.8 g/dL (ref 3.5–5.0)
ALT: 82 U/L — ABNORMAL HIGH (ref 17–63)
AST: 105 U/L — AB (ref 15–41)
Alkaline Phosphatase: 267 U/L — ABNORMAL HIGH (ref 38–126)
Anion gap: 13 (ref 5–15)
BILIRUBIN TOTAL: 1.5 mg/dL — AB (ref 0.3–1.2)
BUN: 21 mg/dL — AB (ref 6–20)
CHLORIDE: 104 mmol/L (ref 101–111)
CO2: 24 mmol/L (ref 22–32)
Calcium: 11.4 mg/dL — ABNORMAL HIGH (ref 8.9–10.3)
Creatinine, Ser: 1.04 mg/dL (ref 0.61–1.24)
GFR calc Af Amer: >60 mL/min (ref >60)
GFR calc non Af Amer: >60 mL/min (ref >60)
GLUCOSE: 116 mg/dL — AB (ref 65–99)
POTASSIUM: 4 mmol/L (ref 3.5–5.1)
SODIUM: 141 mmol/L (ref 135–145)
TOTAL PROTEIN: 7.6 g/dL (ref 6.5–8.1)

## 2017-06-21 LAB — CBC WITH DIFFERENTIAL/PLATELET
BASOS ABS: 0 10*3/uL (ref 0.0–0.1)
BASOS PCT: 0 %
EOS ABS: 0 10*3/uL (ref 0.0–0.7)
EOS PCT: 0 %
HEMATOCRIT: 37.5 % — AB (ref 39.0–52.0)
Hemoglobin: 12.3 g/dL — ABNORMAL LOW (ref 13.0–17.0)
Lymphocytes Relative: 11 %
Lymphs Abs: 0.8 10*3/uL (ref 0.7–4.0)
MCH: 26.7 pg (ref 26.0–34.0)
MCHC: 32.8 g/dL (ref 30.0–36.0)
MCV: 81.5 fL (ref 78.0–100.0)
MONO ABS: 0.9 10*3/uL (ref 0.1–1.0)
MONOS PCT: 11 %
NEUTROS ABS: 6.1 10*3/uL (ref 1.7–7.7)
Neutrophils Relative %: 78 %
PLATELETS: 89 10*3/uL — AB (ref 150–400)
RBC: 4.6 MIL/uL (ref 4.22–5.81)
RDW: 13.3 % (ref 11.5–15.5)
WBC: 7.8 10*3/uL (ref 4.0–10.5)

## 2017-06-21 MED ORDER — OXYCODONE-ACETAMINOPHEN 5-325 MG PO TABS
2.0000 | ORAL_TABLET | Freq: Once | ORAL | Status: AC
  Administered 2017-06-21: 2 via ORAL
  Filled 2017-06-21: qty 2

## 2017-06-21 MED ORDER — IOPAMIDOL (ISOVUE-300) INJECTION 61%
100.0000 mL | Freq: Once | INTRAVENOUS | Status: AC | PRN
  Administered 2017-06-21: 100 mL via INTRAVENOUS

## 2017-06-21 MED ORDER — OXYCODONE-ACETAMINOPHEN 5-325 MG PO TABS: 1.0000 | ORAL_TABLET | ORAL | 0 refills | Status: DC | PRN

## 2017-06-21 NOTE — ED Provider Notes (Signed)
[facility] EMERGENCY DEPARTMENT Provider Note   CSN: [id number] Arrival date & time: 06/21/17  1505     History   Chief Complaint Chief Complaint  Patient presents with  . Back Pain    HPI Garrison Michie is a 72 y.o. male.  The history is provided by the patient. No language interpreter was used.  Abdominal Pain   This is a new problem. The current episode started more than 1 week ago. The problem occurs constantly. The problem has been gradually worsening. The pain is located in the generalized abdominal region. The pain is moderate. Pertinent negatives include vomiting. Nothing aggravates the symptoms. Nothing relieves the symptoms. Past workup does not include GI consult. His past medical history is significant for PUD.  Pt complains of pain in his hip.  Pt reports he has been having discomfort in his abdomen.  His wife reports he has had a lot of weight loss.  Pt reports about 30 pounds in last 3 months.    Past Medical History:  Diagnosis Date  . Arthritis   . BPH (benign prostatic hyperplasia) 12/21/2016  . Hypertension   . Smoker     Patient Active Problem List   Diagnosis Date Noted  . Encounter for well adult exam with abnormal findings 12/21/2016  . Need for hepatitis C screening test 12/21/2016  . BPH (benign prostatic hyperplasia) 12/21/2016  . HLD (hyperlipidemia) 12/21/2016  . Right hip pain 12/21/2016  . Arthritis   . Hypertension   . Smoker     Past Surgical History:  Procedure Laterality Date  . CIRCUMCISION  12/22/2011   Procedure: CIRCUMCISION ADULT;  Surgeon: Hanley Ben, MD;  Location: [facility];  Service: Urology;  Laterality: N/A;  . HYDROCELE EXCISION  12/22/2011   Procedure: HYDROCELECTOMY ADULT;  Surgeon: Hanley Ben, MD;  Location: [place];  Service: Urology;  Laterality: Left;  350 mls drained   . ORIF ELBOW FRACTURE    . rt hip pinning          Home Medications    Prior to Admission  medications   Medication Sig Start Date End Date Taking? Authorizing Provider  amLODipine (NORVASC) 5 MG tablet Take 1 tablet (5 mg total) daily by mouth. 12/21/16   Biagio Borg, MD  aspirin EC 81 MG tablet Take 1 tablet (81 mg total) daily by mouth. 12/21/16   Biagio Borg, MD  beta carotene w/minerals (OCUVITE) tablet Take 1 tablet by mouth daily.    [provider]  naproxen sodium (ANAPROX) 220 MG tablet Take 220 mg by mouth as needed.    [provider]  oxyCODONE-acetaminophen (PERCOCET/ROXICET) 5-325 MG tablet Take 1 tablet by mouth every 4 (four) hours as needed for severe pain. 06/21/17   Fransico Meadow, PA-C  varenicline (CHANTIX CONTINUING MONTH PAK) 1 MG tablet Take 1 tablet (1 mg total) 2 (two) times daily by mouth. 12/21/16   Biagio Borg, MD  varenicline (CHANTIX STARTING MONTH PAK) 0.5 MG X 11 & 1 MG X 42 tablet Take one 0.5 mg tablet by mouth once daily for 3 days, then increase to one 0.5 mg tablet twice daily for 4 days, then increase to one 1 mg tablet twice daily. 12/21/16   Biagio Borg, MD    Family History History reviewed. No pertinent family history.  Social History Social History   Tobacco Use  . Smoking status: Current Every Day Smoker    Packs/day: 0.25  Types: Cigarettes  . Smokeless tobacco: Never Used  Substance Use Topics  . Alcohol use: No  . Drug use: Not on file     Allergies   Patient has no known allergies.   Review of Systems Review of Systems  Gastrointestinal: Positive for abdominal pain. Negative for vomiting.  All other systems reviewed and are negative.    Physical Exam Updated Vital Signs BP (!) 171/79 (BP Location: Right Arm)   Pulse 85   Temp 98.5 F (36.9 C) (Oral)   Resp 20   Ht 6\' 1"  (1.854 m)   Wt 83.9 kg (185 lb)   SpO2 97%   BMI 24.41 kg/m   Physical Exam  Constitutional: He appears well-developed and well-nourished.  HENT:  Head: Normocephalic.  Mouth/Throat: Oropharynx is clear and  moist.  Eyes: Pupils are equal, round, and reactive to light. Conjunctivae are normal.  Neck: Normal range of motion.  Cardiovascular: Normal rate and regular rhythm.  Pulmonary/Chest: Effort normal.  Abdominal: Soft. He exhibits no mass. There is tenderness. There is no guarding.  Musculoskeletal: Normal range of motion.  Tender right hip   Neurological: He is alert.  Nursing note and vitals reviewed.    ED Treatments / Results  Labs (all labs ordered are listed, but only abnormal results are displayed) Labs Reviewed  CBC WITH DIFFERENTIAL/PLATELET - Abnormal; Notable for the following components:      Result Value   Hemoglobin 12.3 (*)    HCT 37.5 (*)    Platelets 89 (*)    All other components within normal limits  COMPREHENSIVE METABOLIC PANEL - Abnormal; Notable for the following components:   Glucose, Bld 116 (*)    BUN 21 (*)    Calcium 11.4 (*)    AST 105 (*)    ALT 82 (*)    Alkaline Phosphatase 267 (*)    Total Bilirubin 1.5 (*)    All other components within normal limits    EKG None  Radiology Dg Chest 2 View  Result Date: 06/21/2017 CLINICAL DATA:  Dyspnea and mid chest pain. EXAM: CHEST - 2 VIEW COMPARISON:  Studies dating back through 01/14/2010 FINDINGS: The heart is top-normal in size and stable. Slight increase in pulmonary vascular congestion since prior. Right lower paratracheal soft tissue prominence is new since prior comparisons dating back through 2011. This may simply reflect engorgement of the azygos vein however adenopathy is not excluded. There is uncoiling of the thoracic aorta with atherosclerosis. Subtle opacity projecting over the anterior right first rib may simply reflect a summation of overlapping ribs and vessels. No definite underlying pulmonary consolidation or mass is seen in two orthogonal views. IMPRESSION: 1. Right paratracheal soft tissue prominence may reflect an engorged azygos vein. Adenopathy is not excluded. CT with IV contrast  may help for further correlation. 2. Subtle opacity in the right upper lobe overlying the anterior first rib likely reflects a summation shadow. No definite mass or pulmonary consolidation is seen on two views. This too can be further assessed on chest CT. 3. Aortic atherosclerosis. 4. Mild pulmonary vascular congestion. Electronically Signed   By: Ashley Royalty M.D.   On: 06/21/2017 20:13   Ct Abdomen Pelvis W Contrast  Result Date: 06/21/2017 CLINICAL DATA:  Weight loss and abdominal pain. EXAM: CT ABDOMEN AND PELVIS WITH CONTRAST TECHNIQUE: Multidetector CT imaging of the abdomen and pelvis was performed using the standard protocol following bolus administration of intravenous contrast. CONTRAST:  158mL ISOVUE-300 IOPAMIDOL (ISOVUE-300) INJECTION 61% COMPARISON:  None. FINDINGS: Lower chest: Subsegmental atelectasis noted in the dependent lung bases. Hepatobiliary: Innumerable liver lesions are compatible with metastatic disease. These range in size from 7 mm up to a dominant anterior right hepatic lesion measuring 4.1 cm (image 9 / series 2). Cholesterol stones are identified in the nondistended gallbladder. No intrahepatic or extrahepatic biliary dilation. Pancreas: No focal mass lesion. No dilatation of the main duct. No intraparenchymal cyst. No peripancreatic edema. Spleen: No splenomegaly. No focal mass lesion. Adrenals/Urinary Tract: No right adrenal nodule or mass. 1.9 cm heterogeneous left adrenal nodule identified. Right kidney unremarkable. 4.9 cm simple cyst identified lower pole left kidney No evidence for hydroureter. The urinary bladder appears normal for the degree of distention. Stomach/Bowel: Stomach is nondistended. No gastric wall thickening. No evidence of outlet obstruction. Duodenum is normally positioned as is the ligament of Treitz. No small bowel wall thickening. No small bowel dilatation. The terminal ileum is normal. The appendix is normal. No gross colonic mass. No colonic wall  thickening. Mild diverticular changes noted in the right colon. Vascular/Lymphatic: There is abdominal aortic atherosclerosis without aneurysm. There is no gastrohepatic or hepatoduodenal ligament lymphadenopathy. No intraperitoneal or retroperitoneal lymphadenopathy. No pelvic sidewall lymphadenopathy. Reproductive: Prostate gland is enlarged. Other: No intraperitoneal free fluid. Musculoskeletal: Innumerable tiny lucent foci are identified in the thoracolumbar spine and bony pelvis, raising concern for metastatic disease. 1 of the larger lesions is seen in the posterior right iliac bone measuring 11 mm on image 51/series 2. Small right groin hernia contains only fat. IMPRESSION: 1. Innumerable liver lesions compatible with metastatic disease. 19 mm left adrenal nodule is concerning for metastatic involvement and there are innumerable lucent bone lesions also highly suggestive of metastatic disease. No primary malignancy is identified in the abdomen or pelvis on today's study. Imaging of the chest may prove helpful to assess for lung primary given the patient's smoking history. 2. Prostatomegaly. 3.  Aortic Atherosclerois (ICD10-170.0) Electronically Signed   By: Misty Stanley M.D.   On: 06/21/2017 19:17    Procedures Procedures (including critical care time)  Medications Ordered in ED Medications  iopamidol (ISOVUE-300) 61 % injection 100 mL (100 mLs Intravenous Contrast Given 06/21/17 1838)  oxyCODONE-acetaminophen (PERCOCET/ROXICET) 5-325 MG per tablet 2 tablet (2 tablets Oral Given 06/21/17 1943)     Initial Impression / Assessment and Plan / ED Course  I have reviewed the triage vital signs and the nursing notes.  Pertinent labs & imaging results that were available during my care of the patient were reviewed by me and considered in my medical decision making (see chart for details).   MDM  Pt has liver and bone lesions seen on ct of his abdomen that radiologist reports appear metastatic.    Pt  and family counseled on findings.  Pt advised top call Dr. Jenny Reichmann tomorrow to be seen for further evaluation.    Pt reports relief of pain with percocet here.  Pt given Rx for percocet.   Final Clinical Impressions(s) / ED Diagnoses   Final diagnoses:  Hip pain, right  Generalized abdominal pain  Liver lesion    ED Discharge Orders        Ordered    oxyCODONE-acetaminophen (PERCOCET/ROXICET) 5-325 MG tablet  Every 4 hours PRN     06/21/17 2026    An After Visit Summary was printed and given to the patient.    Sidney Ace 06/21/17 2221    Davonna Belling, MD 06/22/17 [phone]

## 2017-06-21 NOTE — ED Triage Notes (Signed)
Pt c/o of lower back pain x 2 weeks

## 2017-06-21 NOTE — Discharge Instructions (Signed)
Call tomorrow and schedule to see Dr. Jenny Reichmann.   You need to have further testing.   Your Ct shows lesions to your liver that are concerning for cancer.

## 2017-06-22 ENCOUNTER — Encounter: Payer: Self-pay | Admitting: Internal Medicine

## 2017-06-22 ENCOUNTER — Other Ambulatory Visit (INDEPENDENT_AMBULATORY_CARE_PROVIDER_SITE_OTHER): Payer: Medicare Other

## 2017-06-22 ENCOUNTER — Ambulatory Visit: Payer: Medicare Other | Admitting: Internal Medicine

## 2017-06-22 ENCOUNTER — Telehealth: Payer: Self-pay | Admitting: Internal Medicine

## 2017-06-22 VITALS — BP 154/80 | HR 89 | Temp 98.0°F | Ht 73.0 in | Wt 181.4 lb

## 2017-06-22 DIAGNOSIS — Z23 Encounter for immunization: Secondary | ICD-10-CM | POA: Diagnosis not present

## 2017-06-22 DIAGNOSIS — I10 Essential (primary) hypertension: Secondary | ICD-10-CM

## 2017-06-22 DIAGNOSIS — F17200 Nicotine dependence, unspecified, uncomplicated: Secondary | ICD-10-CM | POA: Diagnosis not present

## 2017-06-22 DIAGNOSIS — C799 Secondary malignant neoplasm of unspecified site: Secondary | ICD-10-CM | POA: Diagnosis not present

## 2017-06-22 DIAGNOSIS — Z1159 Encounter for screening for other viral diseases: Secondary | ICD-10-CM

## 2017-06-22 LAB — BASIC METABOLIC PANEL
BUN: 22 mg/dL (ref 6–23)
CO2: 30 meq/L (ref 19–32)
Calcium: 12.5 mg/dL — ABNORMAL HIGH (ref 8.4–10.5)
Chloride: 102 mEq/L (ref 96–112)
Creatinine, Ser: 1.13 mg/dL (ref 0.40–1.50)
GFR: 81.88 mL/min (ref >60.00)
GLUCOSE: 143 mg/dL — AB (ref 70–99)
POTASSIUM: 4.3 meq/L (ref 3.5–5.1)
Sodium: 141 mEq/L (ref 135–145)

## 2017-06-22 LAB — URINALYSIS, ROUTINE W REFLEX MICROSCOPIC
KETONES UR: NEGATIVE
Leukocytes, UA: NEGATIVE
NITRITE: NEGATIVE
PH: 5.5 (ref 5.0–8.0)
Specific Gravity, Urine: >=1.03 — AB (ref 1.000–1.030)
TOTAL PROTEIN, URINE-UPE24: 30 — AB
UROBILINOGEN UA: 2 — AB (ref 0.0–1.0)
Urine Glucose: NEGATIVE

## 2017-06-22 LAB — TSH: TSH: 0.33 u[IU]/mL — ABNORMAL LOW (ref 0.35–4.50)

## 2017-06-22 LAB — PSA: PSA: 6.3 ng/mL — ABNORMAL HIGH (ref 0.10–4.00)

## 2017-06-22 MED ORDER — HYDROCODONE-ACETAMINOPHEN 5-325 MG PO TABS: 1.0000 | ORAL_TABLET | Freq: Four times a day (QID) | ORAL | 0 refills | Status: DC | PRN

## 2017-06-22 NOTE — Telephone Encounter (Signed)
Ok this is noted  Please contact pt (or wife) to let them know they can call near the end of 5 days for a refill

## 2017-06-22 NOTE — Progress Notes (Signed)
Subjective:    Patient ID: Joseph Hunt, male    DOB: 1945/05/16, 72 y.o.   MRN: [id number]  HPI  Here with 3 wks gradually worsening intermittent mild to mod LBP now worsening more severe, constant, radiates to upper legs off and on for 2 wks, and Pt denies bowel or bladder change, fever, wt loss,  worsening LE pain/numbness/weakness, gait change or falls.   Denies urinary symptoms such as dysuria, frequency, urgency, flank pain, hematuria or n/v, fever, chills.  Denies worsening reflux, abd pain, dysphagia, n/v, bowel change or blood.  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 Arthritis   . BPH (benign prostatic hyperplasia) 12/21/2016  . Hypertension   . Smoker    Past Surgical History:  Procedure Laterality Date  . CIRCUMCISION  12/22/2011   Procedure: CIRCUMCISION ADULT;  Surgeon: Hanley Ben, MD;  Location: [facility];  Service: Urology;  Laterality: N/A;  . HYDROCELE EXCISION  12/22/2011   Procedure: HYDROCELECTOMY ADULT;  Surgeon: Hanley Ben, MD;  Location: [place];  Service: Urology;  Laterality: Left;  350 mls drained   . ORIF ELBOW FRACTURE    . rt hip pinning      reports that he has been smoking cigarettes.  He has been smoking about 0.25 packs per day. He has never used smokeless tobacco. He reports that he does not drink alcohol. His drug history is not on file. family history is not on file. No Known Allergies Current Outpatient Medications on File Prior to Visit  Medication Sig Dispense Refill  . amLODipine (NORVASC) 5 MG tablet Take 1 tablet (5 mg total) daily by mouth. 90 tablet 3  . aspirin EC 81 MG tablet Take 1 tablet (81 mg total) daily by mouth. 100 tablet 11  . naproxen sodium (ANAPROX) 220 MG tablet Take 220 mg by mouth as needed.    Marland Kitchen oxyCODONE-acetaminophen (PERCOCET/ROXICET) 5-325 MG tablet Take 1 tablet by mouth  every 4 (four) hours as needed for severe pain. 15 tablet 0   No current facility-administered medications on file prior to visit.    Review of Systems  Constitutional: Negative for other unusual diaphoresis or sweats HENT: Negative for ear discharge or swelling Eyes: Negative for other worsening visual disturbances Respiratory: Negative for stridor or other swelling  Gastrointestinal: Negative for worsening distension or other blood Genitourinary: Negative for retention or other urinary change Musculoskeletal: Negative for other MSK pain or swelling Skin: Negative for color change or other new lesions Neurological: Negative for worsening tremors and other numbness  Psychiatric/Behavioral: Negative for worsening agitation or other fatigue All other system neg per pt    Objective:   Physical Exam BP (!) 154/80 (BP Location: Left Arm, Patient Position: Sitting, Cuff Size: Normal)   Pulse 89   Temp 98 F (36.7 C) (Oral)   Ht 6\' 1"  (1.854 m)   Wt 181 lb 6 oz (82.3 kg)   SpO2 94%   BMI 23.93 kg/m  VS noted,  Constitutional: Pt appears in NAD HENT: Head: NCAT.  Right Ear: External ear normal.  Left Ear: External ear normal.  Eyes: . Pupils are equal, round, and reactive to light. Conjunctivae and EOM are normal Nose: without d/c or deformity Neck: Neck supple. Gross normal ROM Cardiovascular: Normal rate and regular rhythm.   Pulmonary/Chest: Effort normal and breath sounds without rales or wheezing.  Abd:  Soft, NT, ND, + BS, no organomegaly Spine nontender in midline, has bilat lumbar paravertebral tender spasm Neurological: Pt is alert. At baseline orientation, motor grossly intact Skin: Skin is warm. No rashes, other new lesions, no LE edema Psychiatric: Pt behavior is normal without agitation  No other exam findings    Assessment & Plan:

## 2017-06-22 NOTE — Telephone Encounter (Signed)
Patient's wife advised of dr Judi Cong note/instructions, wife repeated back for understanding

## 2017-06-22 NOTE — Patient Instructions (Signed)
Please continue all other medications as before, and refills have been done if requested.  Please have the pharmacy call with any other refills you may need.  Please keep your appointments with your specialists as you may have planned  Please see the PCC's today for starting the process of referrals for:      1) CT scan of the chest           2) Oncology referral  Please go to the LAB in the Basement (turn left off the elevator) for the tests to be done today  You will be contacted by phone if any changes need to be made immediately.  Otherwise, you will receive a letter about your results with an explanation, but please check with MyChart first.  Please remember to sign up for MyChart if you have not done so, as this will be important to you in the future with finding out test results, communicating by private email, and scheduling acute appointments online when needed.

## 2017-06-22 NOTE — Telephone Encounter (Signed)
Copied from [place] [phone]. Topic: Quick Communication - Rx Refill/Question >> Jun 22, 2017  1:29 PM Oliver Pila B wrote: Pharmacist called b/c due to this being the first time pt is taking the medication below she can only fill it for 5 days, call pharmacy if needed  Medication: HYDROcodone-acetaminophen (NORCO/VICODIN) 5-325 MG tablet [[id number]]  Has the patient contacted their pharmacy? Yes.   (Agent: If no, request that the patient contact the pharmacy for the refill.) Preferred Pharmacy (with phone number or street name): [facility] Agent: Please be advised that RX refills may take up to 3 business days. We ask that you follow-up with your pharmacy.

## 2017-06-22 NOTE — Telephone Encounter (Signed)
Routing to dr john, thanks 

## 2017-06-23 LAB — HEPATITIS C ANTIBODY
Hepatitis C Ab: NONREACTIVE
SIGNAL TO CUT-OFF: 0.05 (ref <1.00)

## 2017-06-23 NOTE — Assessment & Plan Note (Signed)
Pain symptoms highly likely related to new dx metastatic malignancy with bone involvement that would explain the back and pelvic/leg pain.  For pain control (5 days to start due to [place] law) with refills to be authorized later, CT chest to r/o pulm malignancy as origin, urgent refer Oncology, will need tissue diagnosis at that point; d/w pt and wife present today

## 2017-06-23 NOTE — Assessment & Plan Note (Signed)
Mild elevated likely situational, for tx as above, o/w stable overall by history and exam, recent data reviewed with pt, and pt to continue medical treatment as before,  to f/u any worsening symptoms or concerns BP Readings from Last 3 Encounters:  06/22/17 (!) 154/80  06/21/17 (!) 171/79  12/21/16 140/88

## 2017-06-23 NOTE — Assessment & Plan Note (Signed)
Urged to quit 

## 2017-06-25 ENCOUNTER — Ambulatory Visit (INDEPENDENT_AMBULATORY_CARE_PROVIDER_SITE_OTHER)
Admission: RE | Admit: 2017-06-25 | Discharge: 2017-06-25 | Disposition: A | Discharge status: Home / Self Care | Payer: Medicare Other | Source: Ambulatory Visit | Attending: Internal Medicine | Admitting: Internal Medicine

## 2017-06-25 ENCOUNTER — Encounter: Payer: Self-pay | Admitting: Internal Medicine

## 2017-06-25 DIAGNOSIS — C799 Secondary malignant neoplasm of unspecified site: Secondary | ICD-10-CM

## 2017-06-25 MED ORDER — IOPAMIDOL (ISOVUE-300) INJECTION 61%
80.0000 mL | Freq: Once | INTRAVENOUS | Status: AC | PRN
  Administered 2017-06-25: 80 mL via INTRAVENOUS

## 2017-06-26 ENCOUNTER — Other Ambulatory Visit (HOSPITAL_COMMUNITY): Payer: Self-pay | Admitting: Adult Health

## 2017-06-26 DIAGNOSIS — C787 Secondary malignant neoplasm of liver and intrahepatic bile duct: Secondary | ICD-10-CM

## 2017-06-27 ENCOUNTER — Other Ambulatory Visit (HOSPITAL_COMMUNITY): Payer: Self-pay | Admitting: Adult Health

## 2017-06-27 DIAGNOSIS — C787 Secondary malignant neoplasm of liver and intrahepatic bile duct: Secondary | ICD-10-CM

## 2017-06-29 ENCOUNTER — Telehealth: Payer: Self-pay | Admitting: Internal Medicine

## 2017-06-29 ENCOUNTER — Telehealth: Payer: Self-pay

## 2017-06-29 MED ORDER — MORPHINE SULFATE ER 15 MG PO TBCR: 15.0000 mg | EXTENDED_RELEASE_TABLET | Freq: Two times a day (BID) | ORAL | 0 refills | Status: AC

## 2017-06-29 NOTE — Telephone Encounter (Signed)
Spoke to son

## 2017-06-29 NOTE — Telephone Encounter (Signed)
Copied from [place] [phone]. Topic: Referral - Status >> Jun 29, 2017 10:01 AM Arletha Grippe wrote: Reason for CRM: please call son about status of referral to cancer center. Son called cancer center and they said that they had no referral.  Please call [phone]    >> Jun 29, 2017 11:10 AM Cleaster Corin, NT wrote: Pt. Calling to speak with referral coordinator     please call son about status of referral to cancer center. Son called cancer center and they said that they had no referral.   Please call [phone]

## 2017-06-29 NOTE — Telephone Encounter (Signed)
Spoke with Tameka, and she sated that pt's wife picked up 7 day supply.   Copied from [place] [phone]. Topic: General - Other >> Jun 29, 2017  2:59 PM Yvette Rack wrote: Reason for CRM: Racheal Patches from [place], [place] - [place] [address] [phone] (Phone) [phone] (Fax)    calling wanting [place] to know that they had faxed over a request for this medicine morphine (MS CONTIN) 15 MG 12 hr tablet  they would like for this to be filled today.

## 2017-06-29 NOTE — Telephone Encounter (Signed)
Pt's wife has been informed and expressed understanding.

## 2017-06-29 NOTE — Telephone Encounter (Signed)
Copied from [place] [phone]. Topic: Quick Communication - See Telephone Encounter >> Jun 29, 2017  8:35 AM Ether Griffins B wrote: CRM for notification. See Telephone encounter for: 06/29/17.  Pt's wife wanting to know if he can get a stronger medication. His biopsy is on 5/28 and he is in a lot of pain, he is unable to sleep and she states the HYDROcodone-acetaminophen (NORCO/VICODIN) 5-325 MG tablet are not helping him at all.

## 2017-06-29 NOTE — Telephone Encounter (Signed)
[place] for change to ms contin 15 bid - done erx

## 2017-07-02 ENCOUNTER — Telehealth: Payer: Self-pay | Admitting: Internal Medicine

## 2017-07-02 ENCOUNTER — Other Ambulatory Visit: Payer: Self-pay

## 2017-07-02 DIAGNOSIS — C787 Secondary malignant neoplasm of liver and intrahepatic bile duct: Secondary | ICD-10-CM

## 2017-07-02 NOTE — Telephone Encounter (Signed)
Spoke to the pt's wife, Mardene Celeste, to schedule an appt for the pt to see Dr. Julien Nordmann on 5/24 at 930am. She's aware that Mr. Rumpf should arrive 30 minutes early for labs. Confirmed with Mrs. Boxer to cancel medonc appt at [facility]. She said yes. I confirmed a second time to be sure and Mrs. Vi confirmed to cancel appt at [facility].

## 2017-07-03 ENCOUNTER — Inpatient Hospital Stay: Admission: AD | Admit: 2017-07-03 | Payer: Medicare Other | Source: Ambulatory Visit | Admitting: Internal Medicine

## 2017-07-03 ENCOUNTER — Telehealth: Payer: Self-pay | Admitting: Internal Medicine

## 2017-07-03 ENCOUNTER — Emergency Department: Payer: Medicare Other

## 2017-07-03 ENCOUNTER — Other Ambulatory Visit: Payer: Self-pay

## 2017-07-03 ENCOUNTER — Inpatient Hospital Stay
Admission: EM | Admit: 2017-07-03 | Discharge: 2017-07-14 | DRG: 987 | Disposition: E | Payer: Medicare Other | Attending: Internal Medicine | Admitting: Internal Medicine

## 2017-07-03 ENCOUNTER — Encounter: Payer: Self-pay | Admitting: *Deleted

## 2017-07-03 ENCOUNTER — Encounter: Payer: Self-pay | Admitting: Oncology

## 2017-07-03 ENCOUNTER — Inpatient Hospital Stay: Payer: Medicare Other | Attending: Oncology | Admitting: Oncology

## 2017-07-03 ENCOUNTER — Encounter: Payer: Self-pay | Admitting: Emergency Medicine

## 2017-07-03 DIAGNOSIS — Z9889 Other specified postprocedural states: Secondary | ICD-10-CM

## 2017-07-03 DIAGNOSIS — E860 Dehydration: Secondary | ICD-10-CM | POA: Diagnosis present

## 2017-07-03 DIAGNOSIS — R52 Pain, unspecified: Secondary | ICD-10-CM | POA: Diagnosis not present

## 2017-07-03 DIAGNOSIS — Z7189 Other specified counseling: Secondary | ICD-10-CM | POA: Diagnosis not present

## 2017-07-03 DIAGNOSIS — N179 Acute kidney failure, unspecified: Secondary | ICD-10-CM | POA: Diagnosis not present

## 2017-07-03 DIAGNOSIS — C7951 Secondary malignant neoplasm of bone: Secondary | ICD-10-CM

## 2017-07-03 DIAGNOSIS — D6959 Other secondary thrombocytopenia: Secondary | ICD-10-CM | POA: Diagnosis present

## 2017-07-03 DIAGNOSIS — R918 Other nonspecific abnormal finding of lung field: Secondary | ICD-10-CM | POA: Diagnosis not present

## 2017-07-03 DIAGNOSIS — Z6821 Body mass index (BMI) 21.0-21.9, adult: Secondary | ICD-10-CM | POA: Diagnosis not present

## 2017-07-03 DIAGNOSIS — G9340 Encephalopathy, unspecified: Secondary | ICD-10-CM | POA: Diagnosis not present

## 2017-07-03 DIAGNOSIS — Z79899 Other long term (current) drug therapy: Secondary | ICD-10-CM

## 2017-07-03 DIAGNOSIS — E43 Unspecified severe protein-calorie malnutrition: Secondary | ICD-10-CM | POA: Diagnosis present

## 2017-07-03 DIAGNOSIS — Z66 Do not resuscitate: Secondary | ICD-10-CM | POA: Diagnosis present

## 2017-07-03 DIAGNOSIS — Z515 Encounter for palliative care: Secondary | ICD-10-CM | POA: Diagnosis present

## 2017-07-03 DIAGNOSIS — B370 Candidal stomatitis: Secondary | ICD-10-CM | POA: Diagnosis present

## 2017-07-03 DIAGNOSIS — Z7982 Long term (current) use of aspirin: Secondary | ICD-10-CM | POA: Diagnosis not present

## 2017-07-03 DIAGNOSIS — K769 Liver disease, unspecified: Secondary | ICD-10-CM

## 2017-07-03 DIAGNOSIS — D696 Thrombocytopenia, unspecified: Secondary | ICD-10-CM | POA: Diagnosis not present

## 2017-07-03 DIAGNOSIS — R4020 Unspecified coma: Secondary | ICD-10-CM | POA: Diagnosis not present

## 2017-07-03 DIAGNOSIS — E870 Hyperosmolality and hypernatremia: Secondary | ICD-10-CM | POA: Diagnosis not present

## 2017-07-03 DIAGNOSIS — E46 Unspecified protein-calorie malnutrition: Secondary | ICD-10-CM | POA: Diagnosis present

## 2017-07-03 DIAGNOSIS — C3411 Malignant neoplasm of upper lobe, right bronchus or lung: Principal | ICD-10-CM | POA: Diagnosis present

## 2017-07-03 DIAGNOSIS — R64 Cachexia: Secondary | ICD-10-CM | POA: Diagnosis present

## 2017-07-03 DIAGNOSIS — C801 Malignant (primary) neoplasm, unspecified: Secondary | ICD-10-CM

## 2017-07-03 DIAGNOSIS — M899 Disorder of bone, unspecified: Secondary | ICD-10-CM | POA: Diagnosis not present

## 2017-07-03 DIAGNOSIS — R4182 Altered mental status, unspecified: Secondary | ICD-10-CM | POA: Diagnosis not present

## 2017-07-03 DIAGNOSIS — I10 Essential (primary) hypertension: Secondary | ICD-10-CM | POA: Diagnosis present

## 2017-07-03 DIAGNOSIS — C787 Secondary malignant neoplasm of liver and intrahepatic bile duct: Secondary | ICD-10-CM | POA: Diagnosis present

## 2017-07-03 DIAGNOSIS — R590 Localized enlarged lymph nodes: Secondary | ICD-10-CM

## 2017-07-03 DIAGNOSIS — K5900 Constipation, unspecified: Secondary | ICD-10-CM | POA: Diagnosis not present

## 2017-07-03 DIAGNOSIS — R5383 Other fatigue: Secondary | ICD-10-CM | POA: Diagnosis not present

## 2017-07-03 DIAGNOSIS — R32 Unspecified urinary incontinence: Secondary | ICD-10-CM | POA: Diagnosis present

## 2017-07-03 DIAGNOSIS — N400 Enlarged prostate without lower urinary tract symptoms: Secondary | ICD-10-CM | POA: Diagnosis present

## 2017-07-03 DIAGNOSIS — R7989 Other specified abnormal findings of blood chemistry: Secondary | ICD-10-CM | POA: Diagnosis not present

## 2017-07-03 DIAGNOSIS — N19 Unspecified kidney failure: Secondary | ICD-10-CM | POA: Diagnosis not present

## 2017-07-03 DIAGNOSIS — Z87891 Personal history of nicotine dependence: Secondary | ICD-10-CM

## 2017-07-03 DIAGNOSIS — E279 Disorder of adrenal gland, unspecified: Secondary | ICD-10-CM | POA: Diagnosis not present

## 2017-07-03 DIAGNOSIS — R410 Disorientation, unspecified: Secondary | ICD-10-CM | POA: Diagnosis not present

## 2017-07-03 DIAGNOSIS — G9341 Metabolic encephalopathy: Secondary | ICD-10-CM | POA: Diagnosis present

## 2017-07-03 HISTORY — DX: Malignant (primary) neoplasm, unspecified: C80.1

## 2017-07-03 LAB — URINALYSIS, COMPLETE (UACMP) WITH MICROSCOPIC
Bilirubin Urine: NEGATIVE
Glucose, UA: NEGATIVE mg/dL
Ketones, ur: NEGATIVE mg/dL
LEUKOCYTES UA: NEGATIVE
Nitrite: NEGATIVE
Protein, ur: NEGATIVE mg/dL
Specific Gravity, Urine: 1.016 (ref 1.005–1.030)
pH: 5 (ref 5.0–8.0)

## 2017-07-03 LAB — COMPREHENSIVE METABOLIC PANEL
ALT: 249 U/L — ABNORMAL HIGH (ref 17–63)
ANION GAP: 11 (ref 5–15)
AST: 223 U/L — ABNORMAL HIGH (ref 15–41)
Albumin: 3.1 g/dL — ABNORMAL LOW (ref 3.5–5.0)
Alkaline Phosphatase: 563 U/L — ABNORMAL HIGH (ref 38–126)
BILIRUBIN TOTAL: 4 mg/dL — AB (ref 0.3–1.2)
BUN: 60 mg/dL — ABNORMAL HIGH (ref 6–20)
CHLORIDE: 106 mmol/L (ref 101–111)
CO2: 31 mmol/L (ref 22–32)
Calcium: 15 mg/dL (ref 8.9–10.3)
Creatinine, Ser: 1.63 mg/dL — ABNORMAL HIGH (ref 0.61–1.24)
GFR calc Af Amer: 47 mL/min — ABNORMAL LOW (ref >60)
GFR calc non Af Amer: 40 mL/min — ABNORMAL LOW (ref >60)
GLUCOSE: 116 mg/dL — AB (ref 65–99)
POTASSIUM: 3.9 mmol/L (ref 3.5–5.1)
Sodium: 148 mmol/L — ABNORMAL HIGH (ref 135–145)
TOTAL PROTEIN: 6.8 g/dL (ref 6.5–8.1)

## 2017-07-03 LAB — CBC
HEMATOCRIT: 44.1 % (ref 40.0–52.0)
Hemoglobin: 14.4 g/dL (ref 13.0–18.0)
MCH: 26.6 pg (ref 26.0–34.0)
MCHC: 32.5 g/dL (ref 32.0–36.0)
MCV: 81.8 fL (ref 80.0–100.0)
PLATELETS: 119 10*3/uL — AB (ref 150–440)
RBC: 5.4 MIL/uL (ref 4.40–5.90)
RDW: 14 % (ref 11.5–14.5)
WBC: 20.2 10*3/uL — AB (ref 3.8–10.6)

## 2017-07-03 LAB — LACTIC ACID, PLASMA
LACTIC ACID, VENOUS: 4.5 mmol/L — AB (ref 0.5–1.9)
LACTIC ACID, VENOUS: 3.4 mmol/L — AB (ref 0.5–1.9)
LACTIC ACID, VENOUS: 2.3 mmol/L — AB (ref 0.5–1.9)

## 2017-07-03 LAB — AMMONIA: Ammonia: 61 umol/L — ABNORMAL HIGH (ref 9–35)

## 2017-07-03 LAB — TROPONIN I: TROPONIN I: 0.07 ng/mL — AB (ref <0.03)

## 2017-07-03 MED ORDER — SODIUM CHLORIDE 0.9 % IV BOLUS
1000.0000 mL | Freq: Once | INTRAVENOUS | Status: AC
  Administered 2017-07-03: 1000 mL via INTRAVENOUS

## 2017-07-03 MED ORDER — ACETAMINOPHEN 650 MG RE SUPP: 650.0000 mg | Freq: Four times a day (QID) | RECTAL | Status: DC | PRN

## 2017-07-03 MED ORDER — ENOXAPARIN SODIUM 40 MG/0.4ML ~~LOC~~ SOLN
40.0000 mg | SUBCUTANEOUS | Status: DC
  Administered 2017-07-03 – 2017-07-04 (×2): 40 mg via SUBCUTANEOUS
  Filled 2017-07-03 (×2): qty 0.4

## 2017-07-03 MED ORDER — OXYCODONE HCL 5 MG PO TABS: 5.0000 mg | ORAL_TABLET | ORAL | Status: DC | PRN

## 2017-07-03 MED ORDER — ONDANSETRON HCL 4 MG PO TABS: 4.0000 mg | ORAL_TABLET | Freq: Four times a day (QID) | ORAL | Status: DC | PRN

## 2017-07-03 MED ORDER — KETOROLAC TROMETHAMINE 30 MG/ML IJ SOLN
INTRAMUSCULAR | Status: AC
Start: 2017-07-03 — End: 2017-07-03
  Administered 2017-07-03: 15 mg
  Filled 2017-07-03: qty 1

## 2017-07-03 MED ORDER — FLUCONAZOLE IN SODIUM CHLORIDE 200-0.9 MG/100ML-% IV SOLN
100.0000 mg | INTRAVENOUS | Status: DC
  Administered 2017-07-03 – 2017-07-06 (×4): 100 mg via INTRAVENOUS
  Filled 2017-07-03 (×4): qty 50

## 2017-07-03 MED ORDER — SODIUM CHLORIDE 0.9 % IV SOLN
INTRAVENOUS | Status: DC
  Administered 2017-07-03 – 2017-07-04 (×4): via INTRAVENOUS

## 2017-07-03 MED ORDER — ONDANSETRON HCL 4 MG/2ML IJ SOLN: 4.0000 mg | Freq: Four times a day (QID) | INTRAMUSCULAR | Status: DC | PRN

## 2017-07-03 MED ORDER — ACETAMINOPHEN 325 MG PO TABS: 650.0000 mg | ORAL_TABLET | Freq: Four times a day (QID) | ORAL | Status: DC | PRN

## 2017-07-03 MED ORDER — KETOROLAC TROMETHAMINE 15 MG/ML IJ SOLN
15.0000 mg | Freq: Four times a day (QID) | INTRAMUSCULAR | Status: DC | PRN
  Administered 2017-07-04: 08:00:00 15 mg via INTRAVENOUS
  Filled 2017-07-03 (×2): qty 1

## 2017-07-03 MED ORDER — ZOLEDRONIC ACID 4 MG/5ML IV CONC
4.0000 mg | Freq: Once | INTRAVENOUS | Status: AC
  Administered 2017-07-03: 4 mg via INTRAVENOUS
  Filled 2017-07-03: qty 5

## 2017-07-03 NOTE — Progress Notes (Signed)
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  INTERVAL HISTORY: Patient is a 72 year old male who initially presented to the [facility] emergency room approximately 10 to 11 days ago with worsening back pain radiating down his legs.  Patient was found to have widely metastatic disease in his bones and liver with a primary lung mass.  A referral was made to the [place] center, but patient's appointment was not scheduled until next week.  Since his emergency room visit, he has rapidly declined to the point where he is confused and cannot offer review of systems.  His family reports that 2 weeks ago he was playing football in the yard with his family.  He now has minimal PO intake, constant pain, and nearly bedridden.  Patient son also reports a 40-50 pound weight loss over the past several months.  Patient is clearly uncomfortable and confused.  REVIEW OF SYSTEMS:   Review of Systems  Unable to perform ROS: Acuity of condition    PAST MEDICAL HISTORY: Past Medical History:  Diagnosis Date  . Arthritis   . BPH (benign prostatic hyperplasia) 12/21/2016  . Cancer ([place])   . Hypertension   . Smoker     PAST SURGICAL HISTORY: Past Surgical History:  Procedure Laterality Date  . CIRCUMCISION  12/22/2011   Procedure: CIRCUMCISION ADULT;  Surgeon: Hanley Ben, MD;  Location: [facility];  Service: Urology;  Laterality: N/A;  . HYDROCELE EXCISION  12/22/2011   Procedure: HYDROCELECTOMY ADULT;  Surgeon: Hanley Ben, MD;  Location: [place];  Service: Urology;  Laterality: Left;  350 mls drained   . ORIF ELBOW FRACTURE    .  rt hip pinning      FAMILY HISTORY: Family History  Problem Relation Age of Onset  . Diabetes Mother   . Prostate cancer Father   . Lung cancer Sister   . Cancer Sister     ADVANCED DIRECTIVES (Y/N):  N  HEALTH MAINTENANCE: Social History   Tobacco Use  . Smoking status: Former Smoker    Packs/day: 0.25    Years: 50.00    Pack years: 12.50    Types: Cigarettes    Last attempt to quit: 06/13/2017    Years since quitting: 0.0  . Smokeless tobacco: Never Used  Substance Use Topics  . Alcohol use: No  . Drug use: Never     Colonoscopy:  PAP:  Bone density:  Lipid panel:  No Known Allergies  No current facility-administered medications for this visit.    Current Outpatient Medications  Medication Sig Dispense Refill  . amLODipine (NORVASC) 5 MG tablet Take 1 tablet (5 mg total) daily by mouth. 90 tablet 3  . morphine (MS CONTIN) 15 MG 12 hr tablet Take 1 tablet (15 mg total) by mouth every 12 (twelve) hours. For chronic malignant pain 60 tablet 0  . aspirin EC 81 MG tablet Take 1 tablet (81 mg total) daily by mouth. (Patient not taking: Reported on 07/10/2017) 100 tablet 11  . naproxen sodium (ANAPROX) 220 MG tablet Take 220 mg by mouth as needed.     Facility-Administered Medications Ordered in Other Visits  Medication Dose Route Frequency Provider Last Rate Last  Dose  . sodium chloride 0.9 % bolus 1,000 mL  1,000 mL Intravenous Once Harvest Dark, MD        OBJECTIVE: Vitals:   06/26/2017 1519  BP: (!) 156/94  Pulse: (!) 130  Resp: (!) 22  Temp: (!) 96.4 F (35.8 C)     Body mass index is 21.07 kg/m.    ECOG FS:3 - Symptomatic, >50% confined to bed  General: Thin ill-appearing, moderate distress secondary to pain. Eyes: Pink conjunctiva, anicteric sclera. HEENT: Normocephalic, moist mucous membranes, clear oropharnyx. Lungs: Clear to auscultation bilaterally. Heart: Regular rate and rhythm. No rubs, murmurs, or gallops. Abdomen: Soft, nontender,  nondistended. No organomegaly noted, normoactive bowel sounds. Musculoskeletal: No edema, cyanosis, or clubbing. Neuro: Confused. Cranial nerves grossly intact. Skin: No rashes or petechiae noted.  LAB RESULTS:  Lab Results  Component Value Date   NA 141 06/22/2017   K 4.3 06/22/2017   CL 102 06/22/2017   CO2 30 06/22/2017   GLUCOSE 143 (H) 06/22/2017   BUN 22 06/22/2017   CREATININE 1.13 06/22/2017   CALCIUM 12.5 (H) 06/22/2017   PROT 7.6 06/21/2017   ALBUMIN 3.8 06/21/2017   AST 105 (H) 06/21/2017   ALT 82 (H) 06/21/2017   ALKPHOS 267 (H) 06/21/2017   BILITOT 1.5 (H) 06/21/2017   GFRNONAA >60 06/21/2017   GFRAA >60 06/21/2017    Lab Results  Component Value Date   WBC 7.8 06/21/2017   NEUTROABS 6.1 06/21/2017   HGB 12.3 (L) 06/21/2017   HCT 37.5 (L) 06/21/2017   MCV 81.5 06/21/2017   PLT 89 (L) 06/21/2017     STUDIES: Dg Chest 2 View  Result Date: 06/21/2017 CLINICAL DATA:  Dyspnea and mid chest pain. EXAM: CHEST - 2 VIEW COMPARISON:  Studies dating back through 01/14/2010 FINDINGS: The heart is top-normal in size and stable. Slight increase in pulmonary vascular congestion since prior. Right lower paratracheal soft tissue prominence is new since prior comparisons dating back through 2011. This may simply reflect engorgement of the azygos vein however adenopathy is not excluded. There is uncoiling of the thoracic aorta with atherosclerosis. Subtle opacity projecting over the anterior right first rib may simply reflect a summation of overlapping ribs and vessels. No definite underlying pulmonary consolidation or mass is seen in two orthogonal views. IMPRESSION: 1. Right paratracheal soft tissue prominence may reflect an engorged azygos vein. Adenopathy is not excluded. CT with IV contrast may help for further correlation. 2. Subtle opacity in the right upper lobe overlying the anterior first rib likely reflects a summation shadow. No definite mass or pulmonary consolidation  is seen on two views. This too can be further assessed on chest CT. 3. Aortic atherosclerosis. 4. Mild pulmonary vascular congestion. Electronically Signed   By: Ashley Royalty M.D.   On: 06/21/2017 20:13   Ct Chest W Contrast  Result Date: 06/25/2017 CLINICAL DATA:  New diagnosis of widespread metastatic disease to the liver and bones on recent CT abdomen/pelvis study performed for weight loss and abdominal pain. EXAM: CT CHEST WITH CONTRAST TECHNIQUE: Multidetector CT imaging of the chest was performed during intravenous contrast administration. CONTRAST:  61mL ISOVUE-300 IOPAMIDOL (ISOVUE-300) INJECTION 61% COMPARISON:  06/21/2017 CT abdomen/pelvis. 06/21/2017 chest radiograph. FINDINGS: Cardiovascular: Top-normal heart size. No significant pericardial effusion/thickening. Ectatic 4.0 cm ascending thoracic aorta. Normal caliber pulmonary arteries. No central pulmonary emboli. Mediastinum/Nodes: Suspected hypodense 1.7 cm left thyroid lobe nodule, poorly delineated on this scan due to streak artifact from dense left subclavian vein iodinated contrast. Unremarkable  esophagus. No axillary adenopathy. Enlarged 1.4 cm right supraclavicular node (series 2/image 17). Enlarged 2.0 cm prevascular right mediastinal node with mass-effect on the left brachiocephalic vein (series 2/image 53). Multiple enlarged right paratracheal nodes, largest 3.0 cm (series 2/image 60). No hilar adenopathy. Lungs/Pleura: No pneumothorax. No pleural effusion. Irregular solid right upper lobe lung mass measuring 2.2 x 2.0 x 3.9 cm (series 3/image 30). Hypoventilatory changes in dependent lower lobes. No additional significant pulmonary nodules. Mild centrilobular emphysema. Upper abdomen: Redemonstration innumerable low-attenuation liver masses scattered throughout the visualized liver, better delineated on the recent CT abdomen study. Stable 2.2 cm left adrenal mass. Musculoskeletal: Innumerable small lytic osseous lesions throughout the  thoracic skeleton, most prominent in thoracic vertebral bodies, for example at T6 (series 6/image 80). Mild thoracic spondylosis. IMPRESSION: 1. Irregular solid right upper lobe 2.2 x 2.0 x 3.9 cm lung mass, most compatible with primary bronchogenic carcinoma. 2. Bulky right paratracheal and right prevascular mediastinal adenopathy. Mild right supraclavicular adenopathy. 3. Innumerable small lytic osseous lesions throughout the thoracic skeleton suspicious for widespread osseous metastatic disease. 4. Redemonstration of innumerable liver metastases and left adrenal mass suspicious for adrenal metastasis. 5. Ectatic 4.0 cm ascending thoracic aorta. Recommend annual imaging followup by CTA or MRA. This recommendation follows 2010 ACCF/AHA/AATS/ACR/ASA/SCA/SCAI/SIR/STS/SVM Guidelines for the Diagnosis and Management of Patients with Thoracic Aortic Disease. Circulation. 2010; 121: U045-W098. Aortic Atherosclerosis (ICD10-I70.0) and Emphysema (ICD10-J43.9). These results will be called to the ordering clinician or representative by the Radiology Department at the imaging location. Electronically Signed   By: Ilona Sorrel M.D.   On: 06/25/2017 15:52   Ct Abdomen Pelvis W Contrast  Result Date: 06/21/2017 CLINICAL DATA:  Weight loss and abdominal pain. EXAM: CT ABDOMEN AND PELVIS WITH CONTRAST TECHNIQUE: Multidetector CT imaging of the abdomen and pelvis was performed using the standard protocol following bolus administration of intravenous contrast. CONTRAST:  116mL ISOVUE-300 IOPAMIDOL (ISOVUE-300) INJECTION 61% COMPARISON:  None. FINDINGS: Lower chest: Subsegmental atelectasis noted in the dependent lung bases. Hepatobiliary: Innumerable liver lesions are compatible with metastatic disease. These range in size from 7 mm up to a dominant anterior right hepatic lesion measuring 4.1 cm (image 9 / series 2). Cholesterol stones are identified in the nondistended gallbladder. No intrahepatic or extrahepatic biliary  dilation. Pancreas: No focal mass lesion. No dilatation of the main duct. No intraparenchymal cyst. No peripancreatic edema. Spleen: No splenomegaly. No focal mass lesion. Adrenals/Urinary Tract: No right adrenal nodule or mass. 1.9 cm heterogeneous left adrenal nodule identified. Right kidney unremarkable. 4.9 cm simple cyst identified lower pole left kidney No evidence for hydroureter. The urinary bladder appears normal for the degree of distention. Stomach/Bowel: Stomach is nondistended. No gastric wall thickening. No evidence of outlet obstruction. Duodenum is normally positioned as is the ligament of Treitz. No small bowel wall thickening. No small bowel dilatation. The terminal ileum is normal. The appendix is normal. No gross colonic mass. No colonic wall thickening. Mild diverticular changes noted in the right colon. Vascular/Lymphatic: There is abdominal aortic atherosclerosis without aneurysm. There is no gastrohepatic or hepatoduodenal ligament lymphadenopathy. No intraperitoneal or retroperitoneal lymphadenopathy. No pelvic sidewall lymphadenopathy. Reproductive: Prostate gland is enlarged. Other: No intraperitoneal free fluid. Musculoskeletal: Innumerable tiny lucent foci are identified in the thoracolumbar spine and bony pelvis, raising concern for metastatic disease. 1 of the larger lesions is seen in the posterior right iliac bone measuring 11 mm on image 51/series 2. Small right groin hernia contains only fat. IMPRESSION: 1. Innumerable liver lesions compatible with metastatic  disease. 19 mm left adrenal nodule is concerning for metastatic involvement and there are innumerable lucent bone lesions also highly suggestive of metastatic disease. No primary malignancy is identified in the abdomen or pelvis on today's study. Imaging of the chest may prove helpful to assess for lung primary given the patient's smoking history. 2. Prostatomegaly. 3.  Aortic Atherosclerois (ICD10-170.0) Electronically Signed    By: Misty Stanley M.D.   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  PLAN:    1. 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  Patient will require ultrasound-guided biopsy of one of his liver lesions tomorrow to confirm the diagnosis.  He will also require an MRI of the brain to complete the staging work-up.   2.  Hypercalcemia: Likely secondary to underlying malignancy.  Patient was sent to the emergency room for admission.  This is likely the etiology of his confusion.  Recommend IV fluids as well as 4 mg IV Zometa.  Repeat calcium level in the morning. 3.  Pain: Patient currently on MS Contin with uncontrolled pain.  Patient is clearly uncomfortable, but difficult to assess given his confusion. 4.  Thrombocytopenia: Likely secondary to underlying malignancy.  Monitor. 5.  Confusion: Likely related to his hypercalcemia, but patient will require an MRI of his brain as above to rule out metastasis. 6.  Constipation: Patient's family reports patient has not had a bowel movement nearly a week. 7.  Disposition: Patient was transported to the emergency room for admission to the hospital for treatment of his hypercalcemia and continued work-up of his presumed lung cancer.  Approximately 60 minutes was spent in discussion of which greater than 50% was consultation.   Patient expressed understanding and was in agreement with this plan. He also understands that He can call clinic at any time with any questions, concerns, or complaints.   Cancer Staging No matching staging information was found for the patient.  Lloyd Huger, MD   07/12/2017 5:01 PM

## 2017-07-03 NOTE — ED Provider Notes (Signed)
[facility] Emergency Department Provider Note  Time seen: 5:01 PM  I have reviewed the triage vital signs and the nursing notes.   HISTORY  Chief Complaint Abnormal Lab    HPI Joseph Hunt is a 72 y.o. male with a past medical history of hypertension, BPH, liver cancer, presents from the oncology center with altered mental status and high calcium.  According to family and record review the patient was being seen by Dr. Grayland Ormond today for new patient evaluation, per note patient has lung cancer that is widely metastatic.  Not currently on chemotherapy or radiation as of yet.  Wife states over the past 1 week the patient has grown more more confused, currently is not able to tell me his name, disoriented to place time and situation.  Patient is able to communicate, will mostly moan, but will speak on occasion for instance when trying to move the patient he states "give me a minute."  Patient incontinent of urine in the bed.  Unable to answer review of systems questions.  Wife denies any recent fever.  Patient does have hip pain per wife, likely due to bony metastases.   Past Medical History:  Diagnosis Date  . Arthritis   . BPH (benign prostatic hyperplasia) 12/21/2016  . Cancer ([place])   . Hypertension   . Smoker     Patient Active Problem List   Diagnosis Date Noted  . Metastatic malignant neoplasm ([place]) 06/22/2017  . Encounter for well adult exam with abnormal findings 12/21/2016  . Need for hepatitis C screening test 12/21/2016  . BPH (benign prostatic hyperplasia) 12/21/2016  . HLD (hyperlipidemia) 12/21/2016  . Right hip pain 12/21/2016  . Arthritis   . Hypertension   . Smoker     Past Surgical History:  Procedure Laterality Date  . CIRCUMCISION  12/22/2011   Procedure: CIRCUMCISION ADULT;  Surgeon: Hanley Ben, MD;  Location: [facility];  Service: Urology;  Laterality: N/A;  . HYDROCELE EXCISION  12/22/2011   Procedure:  HYDROCELECTOMY ADULT;  Surgeon: Hanley Ben, MD;  Location: [place];  Service: Urology;  Laterality: Left;  350 mls drained   . ORIF ELBOW FRACTURE    . rt hip pinning      Prior to Admission medications   Medication Sig Start Date End Date Taking? Authorizing Provider  amLODipine (NORVASC) 5 MG tablet Take 1 tablet (5 mg total) daily by mouth. 12/21/16   Biagio Borg, MD  aspirin EC 81 MG tablet Take 1 tablet (81 mg total) daily by mouth. Patient not taking: Reported on 06/30/2017 12/21/16   Biagio Borg, MD  morphine (MS CONTIN) 15 MG 12 hr tablet Take 1 tablet (15 mg total) by mouth every 12 (twelve) hours. For chronic malignant pain 06/29/17   Biagio Borg, MD  naproxen sodium (ANAPROX) 220 MG tablet Take 220 mg by mouth as needed.    [provider]    No Known Allergies  Family History  Problem Relation Age of Onset  . Diabetes Mother   . Prostate cancer Father   . Lung cancer Sister   . Cancer Sister     Social History Social History   Tobacco Use  . Smoking status: Former Smoker    Packs/day: 0.25    Years: 50.00    Pack years: 12.50    Types: Cigarettes    Last attempt to quit: 06/13/2017    Years since quitting: 0.0  . Smokeless tobacco: Never  Used  Substance Use Topics  . Alcohol use: No  . Drug use: Never    Review of Systems Unable to obtain an adequate/accurate review of systems secondary to altered mental status  ____________________________________________   PHYSICAL EXAM:  VITAL SIGNS: ED Triage Vitals  Enc Vitals Group     BP 07/10/2017 1640 (!) 165/101     Pulse Rate 07/13/2017 1640 (!) 135     Resp 07/10/2017 1640 (!) 38     Temp 07/05/2017 1640 98 F (36.7 C)     Temp Source 06/15/2017 1640 Oral     SpO2 06/18/2017 1640 94 %     Weight 06/27/2017 1634 160 lb (72.6 kg)     Height 07/04/2017 1634 6\' 1"  (1.854 m)     Head Circumference --      Peak Flow --      Pain Score --      Pain Loc --      Pain Edu? --      Excl.  in [place]? --     Constitutional: Patient is awake and alert, but disoriented.  He is in no acute distress, but does continue to move around in the bed, wife states he does this fairly often due to discomfort likely due to bony metastases. Eyes: Normal exam ENT   Head: Normocephalic   Nose: No congestion/rhinnorhea.   Mouth/Throat: Mucous membranes are moist. Cardiovascular: Normal rate, regular rhythm.  Respiratory: Normal respiratory effort without tachypnea nor retractions. Breath sounds are clear Gastrointestinal: Soft and nontender. No distention.  No reaction to abdominal palpation Musculoskeletal: Nontender with normal range of motion in all extremities. Neurologic: Patient is able to speak, when he does speak it is normal.  He appears to be able to move all extremities well, but due to his acute confusion I am not able to perform an adequate neurological exam. Skin:  Skin is warm, dry and intact.  Psychiatric: Mood and affect are normal.  ____________________________________________    EKG  EKG reviewed and interpreted by myself shows sinus tachycardia 129 bpm with a narrow QRS, normal axis, normal intervals, nonspecific ST changes.  ____________________________________________    RADIOLOGY  CT scan shows no acute abnormality.  IMPRESSION: 1. Tortuous thoracic aorta. 2. Right paratracheal soft tissue prominence is noted which cannot exclude mediastinal adenopathy though other etiologies such as tortuous great vessels or prominent mediastinal fat may also present in such fashion. 3. No active pulmonary disease. 4. Irregular lytic lucencies noted of the proximal left humeral shaft may reflect stigmata of the patient's reported bone cancer.  ____________________________________________   INITIAL IMPRESSION / ASSESSMENT AND PLAN / ED COURSE  Pertinent labs & imaging results that were available during my care of the patient were reviewed by me and considered in  my medical decision making (see chart for details).  Patient presents to the emergency department with altered mental status as well as a reported hypercalcemia greater than 12 from his new patient visit at the oncology office.  Here the patient is disoriented, appears uncomfortable at times but in no distress.  Is confused.  Wife states this has been progressively worsening over the past 1 week.  We will check labs, x-ray, CT scan of the head, urinalysis, will also obtain ammonia level as well as a lactic acid.  Patient's calcium is elevated to 15.  I have ordered IV fluids, zoledronic acid.  We will continue IV hydration.  Awaiting remainder of labs and imaging.  Patient will require admission to  the hospital.   Patient's white blood cell count is elevated at 20,000, however he is afebrile, I do not believe this to be infectious I believe this to be a direct result of the patient's metastatic condition with significant hypercalcemia altered mental state and dehydration.  Patient has elevated lactic but again likely related to his metastatic condition.  Do not believe the patient to be septic.  We will continue with treatment focused on reducing the patient's serum calcium concentration, IV hydration with continued close monitoring.  Patient admitted to the hospitalist service.   CRITICAL CARE Performed by: Harvest Dark   Total critical care time: 30 minutes  Critical care time was exclusive of separately billable procedures and treating other patients.  Critical care was necessary to treat or prevent imminent or life-threatening deterioration.  Critical care was time spent personally by me on the following activities: development of treatment plan with patient and/or surrogate as well as nursing, discussions with consultants, evaluation of patient's response to treatment, examination of patient, obtaining history from patient or surrogate, ordering and performing treatments and  interventions, ordering and review of laboratory studies, ordering and review of radiographic studies, pulse oximetry and re-evaluation of patient's condition.   ____________________________________________   FINAL CLINICAL IMPRESSION(S) / ED DIAGNOSES  Altered mental status Hypercalcemia   Harvest Dark, MD 07/13/2017 [phone]

## 2017-07-03 NOTE — Progress Notes (Signed)
Here for new pt evaluation. Pt sat mostly mute in w/c. Eye closed often. Oriented to place and month only -rarely answers questions. Breathing is panting w occasional moaning. Pain c/o mid abd and both hips-unable to state number to pain-son helping w eval.  Ms contin started Sat 5/18 w small hard Bm-taking limited fluids , took peaches yesterday. Minimal solids taken. Pt helpful.

## 2017-07-03 NOTE — ED Notes (Signed)
Pt appears uncomfortable, pt unable to verbalize where he is hurting however he is moving in bed and making a moaning noise, see MAR for follow up.

## 2017-07-03 NOTE — ED Notes (Signed)
Date and time results received: 06/29/2017 5:50 PM (use smartphrase ".now" to insert current time)  Test: Lactic Critical Value: 4.5  Name of Provider Notified: Dr.Paduchowski  Orders Received? Or Actions Taken?: Critical Results Acknowledged

## 2017-07-03 NOTE — ED Triage Notes (Signed)
Pt presents to ED via wheelchair from the cancer center with c/o abnormal labs, pain, and confusion. Per RN from cancer center pt was seen at [facility] and Calcium was 12.9. Pt with pain from Bone cancer and confusion.

## 2017-07-03 NOTE — Progress Notes (Signed)
Advanced care plan.  Purpose of the Encounter: CODE STATUS  Parties in [place], wife [place]  And son Patient's Decision Capacity: Not intact  Subjective/Patient's story: Patient is a 72 year old African-American male who recently presented to outlying facility diagnosed with abdominal pain and was noted to have multiple liver lesions.  And a lung mass. He came to see oncology today and was very altered.  Therefore he was sent to the ER in the ER he is noted to have significant hypercalcemia currently he is confused  Objective/Medical story I discussed with the patient's wife and son regarding his current presentation and previous noted imaging studies consistent with metastatic cancer which is likely lung.  I asked him about cardiac resuscitation and need for intubation if needed.  They stated that he was doing well prior to this and would like everything done   Goals of care determination: Full code    CODE STATUS: Full code   Time spent discussing advanced care planning: 16 minutes

## 2017-07-03 NOTE — Telephone Encounter (Signed)
Received a Advertising account executive from [place] at [place] today. Called [facility] to speak to [name]. Pt had an appointment with Dr. Grayland Ormond today and is now being admitted into the hospital. Per Dietrich Pates, Dr. Grayland Ormond has cancelled the bx scheduled on 5/28 and will order to have done while the pt is in the hospital. Will send Dr. Julien Nordmann a message regarding this change.

## 2017-07-03 NOTE — H&P (Signed)
[facility] [place] NAME: Joseph Hunt    MR#:  [id number]  DATE OF BIRTH:  06-25-1945  DATE OF ADMISSION:  06/18/2017  PRIMARY CARE PHYSICIAN: Biagio Borg, MD   REQUESTING/REFERRING PHYSICIAN: Harvest Dark, MD  CHIEF COMPLAINT:   Chief Complaint  Patient presents with  . Abnormal Lab    HISTORY OF PRESENT ILLNESS: Joseph Hunt  is a 72 y.o. male with a known history of BPH, essential hypertension who was sent from the oncology clinic for altered mental status.  Patient presented to any pain emergency room approximately 10 to 11 days with worsening back pain radiating down to his leg.  Patient underwent a work-up with CT scan which showed right upper lobe lung mass and widespread metastatic disease in the liver and bone.  Patient was referred to oncology which he came to see Dr. Delight Hoh today.  Patient was confused and appeared very dehydrated therefore was sent to the ER for further evaluation.  According to his wife he has had confusion for the past few days.  Is not eating or drinking much.  He was still able to ambulate.  In the ER he is noted to have severe hypercalcemia.  He is unable to provide any review of systems due to his mental status.  PAST MEDICAL HISTORY:   Past Medical History:  Diagnosis Date  . Arthritis   . BPH (benign prostatic hyperplasia) 12/21/2016  . Cancer ([place])   . Hypertension   . Smoker     PAST SURGICAL HISTORY:  Past Surgical History:  Procedure Laterality Date  . CIRCUMCISION  12/22/2011   Procedure: CIRCUMCISION ADULT;  Surgeon: Hanley Ben, MD;  Location: [facility];  Service: Urology;  Laterality: N/A;  . HYDROCELE EXCISION  12/22/2011   Procedure: HYDROCELECTOMY ADULT;  Surgeon: Hanley Ben, MD;  Location: [facility];  Service: Urology;  Laterality: Left;  350 mls drained   . ORIF ELBOW FRACTURE    . rt hip pinning      SOCIAL HISTORY:   Social History   Tobacco Use  . Smoking status: Former Smoker    Packs/day: 0.25    Years: 50.00    Pack years: 12.50    Types: Cigarettes    Last attempt to quit: 06/13/2017    Years since quitting: 0.0  . Smokeless tobacco: Never Used  Substance Use Topics  . Alcohol use: No    FAMILY HISTORY:  Family History  Problem Relation Age of Onset  . Diabetes Mother   . Prostate cancer Father   . Lung cancer Sister   . Cancer Sister     DRUG ALLERGIES: No Known Allergies  REVIEW OF SYSTEMS:   CONSTITUTIONAL:  Unable to provide due to his altered mental status   MEDICATIONS AT HOME:  Prior to Admission medications   Medication Sig Start Date End Date Taking? Authorizing Provider  amLODipine (NORVASC) 5 MG tablet Take 1 tablet (5 mg total) daily by mouth. 12/21/16   Biagio Borg, MD  aspirin EC 81 MG tablet Take 1 tablet (81 mg total) daily by mouth. Patient not taking: Reported on 07/10/2017 12/21/16   Biagio Borg, MD  morphine (MS CONTIN) 15 MG 12 hr tablet Take 1 tablet (15 mg total) by mouth every 12 (twelve) hours. For chronic malignant pain 06/29/17   Biagio Borg, MD  naproxen sodium (ANAPROX) 220 MG tablet Take 220 mg by mouth as needed.  [provider]      PHYSICAL EXAMINATION:   VITAL SIGNS: Blood pressure (!) 143/96, pulse (!) 118, temperature 98 F (36.7 C), temperature source Oral, resp. rate 13, height 6\' 1"  (1.854 m), weight 72.6 kg (160 lb), SpO2 94 %.  GENERAL:  72 y.o.-year-old patient lying in the bed restless.  EYES: Pupils equal, round, reactive to light and accommodation. No scleral icterus. HEENT: Head atraumatic, normocephalic. Oropharynx mouth very dry, he has thrush on back of his throat NECK:  Supple, no jugular venous distention. No thyroid enlargement, no tenderness.  LUNGS: Normal breath sounds bilaterally, no wheezing, rales,rhonchi or crepitation. No use of accessory muscles of respiration.  CARDIOVASCULAR: S1, S2 tachycardic.  No murmurs, rubs, or gallops.  ABDOMEN: Soft, nontender, nondistended. Bowel sounds present. No organomegaly or mass.  EXTREMITIES: No pedal edema, cyanosis, or clubbing.  NEUROLOGIC: Confused  pSYCHIATRIC: The patient is confused  sKIN: No obvious rash, lesion, or ulcer.   LABORATORY PANEL:   CBC Recent Labs  Lab 06/27/2017 1652  WBC 20.2*  HGB 14.4  HCT 44.1  PLT 119*  MCV 81.8  MCH 26.6  MCHC 32.5  RDW 14.0   ------------------------------------------------------------------------------------------------------------------  Chemistries  Recent Labs  Lab 07/09/2017 1652  NA 148*  K 3.9  CL 106  CO2 31  GLUCOSE 116*  BUN 60*  CREATININE 1.63*  CALCIUM 15.0*  AST 223*  ALT 249*  ALKPHOS 563*  BILITOT 4.0*   ------------------------------------------------------------------------------------------------------------------ estimated creatinine clearance is 42.1 mL/min (A) (by C-G formula based on SCr of 1.63 mg/dL (H)). ------------------------------------------------------------------------------------------------------------------ No results for input(s): TSH, T4TOTAL, T3FREE, THYROIDAB in the last 72 hours.  Invalid input(s): FREET3   Coagulation profile No results for input(s): INR, PROTIME in the last 168 hours. ------------------------------------------------------------------------------------------------------------------- No results for input(s): DDIMER in the last 72 hours. -------------------------------------------------------------------------------------------------------------------  Cardiac Enzymes No results for input(s): CKMB, TROPONINI, MYOGLOBIN in the last 168 hours.  Invalid input(s): CK ------------------------------------------------------------------------------------------------------------------ Invalid input(s):  POCBNP  ---------------------------------------------------------------------------------------------------------------  Urinalysis    Component Value Date/Time   COLORURINE YELLOW 06/22/2017 [place] 06/22/2017 1155   LABSPEC >=1.030 (A) 06/22/2017 1155   PHURINE 5.5 06/22/2017 1155   GLUCOSEU NEGATIVE 06/22/2017 1155   HGBUR SMALL (A) 06/22/2017 1155   BILIRUBINUR SMALL (A) 06/22/2017 1155   KETONESUR NEGATIVE 06/22/2017 1155   UROBILINOGEN 2.0 (A) 06/22/2017 1155   NITRITE NEGATIVE 06/22/2017 1155   LEUKOCYTESUR NEGATIVE 06/22/2017 1155     RADIOLOGY: Ct Head Wo Contrast  Result Date: 06/14/2017 CLINICAL DATA:  Altered level of consciousness. Patient presents from cancer center with abnormal labs, pain and confusion. History of bone cancer. EXAM: CT HEAD WITHOUT CONTRAST TECHNIQUE: Contiguous axial images were obtained from the base of the skull through the vertex without intravenous contrast. COMPARISON:  None. FINDINGS: Brain: Atrophy with remote appearing small vessel ischemia. No intra-axial mass nor extra-axial fluid collections. Midline fourth ventricle and basal cisterns without effacement. Vascular: No hyperdense vessel sign. Skull: No aggressive lytic or blastic disease.  No fracture. Sinuses/Orbits: Clear mastoids and paranasal sinuses. Intact orbits and globes. Other: None IMPRESSION: Atrophy with chronic appearing small vessel ischemia. No acute intracranial abnormality. Electronically Signed   By: Ashley Royalty M.D.   On: 07/08/2017 17:35   Dg Chest Portable 1 View  Result Date: 06/22/2017 CLINICAL DATA:  Altered level of consciousness.  Bone cancer. EXAM: PORTABLE CHEST 1 VIEW COMPARISON:  None. FINDINGS: The heart size and mediastinal contours are within normal limits. Tortuous thoracic aorta without aneurysm.  Right paratracheal soft tissue prominence may reflect tortuous great vessels or potentially mediastinal adenopathy. Both lungs are clear.  Nonspecific lytic appearance of the proximal left humeral shaft. IMPRESSION: 1. Tortuous thoracic aorta. 2. Right paratracheal soft tissue prominence is noted which cannot exclude mediastinal adenopathy though other etiologies such as tortuous great vessels or prominent mediastinal fat may also present in such fashion. 3. No active pulmonary disease. 4. Irregular lytic lucencies noted of the proximal left humeral shaft may reflect stigmata of the patient's reported bone cancer. Electronically Signed   By: Ashley Royalty M.D.   On: 07/04/2017 17:39    EKG: Orders placed or performed during the hospital encounter of 07/02/2017  . ED EKG  . ED EKG  . EKG 12-Lead  . EKG 12-Lead    IMPRESSION AND PLAN: Patient is a 72 year old African-American male presenting with altered mental status noted to have severe hypercalcemia  1.  Acute encephalopathy suspect this is due to severe hypercalcemia and dehydration We will give him aggressive IV fluids follow calcium levels  2.  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 Also oncology recommends MRI of the brain.  4.  Oral thrush Unable to do nystatin swish and swallow I will give him a dose of IV fluconazole  5.  Leukocytosis no evidence of infection will monitor for fevers hold off on any antibiotics now  6.  Miscellaneous Lovenox for DVT prophylaxis     All the records are reviewed and case discussed with ED provider. Management plans discussed with the patient, family and they are in agreement.  CODE STATUS:full    TOTAL TIME TAKING CARE OF THIS PATIENT: 55 minutes.    Dustin Flock M.D on 06/26/2017 at 6:36  PM  Between 7am to 6pm - Pager - [phone]  After 6pm go to [email or web address] - password [email or web address] [facility]  [phone]  CC: Primary care physician; Biagio Borg, MD

## 2017-07-04 DIAGNOSIS — C787 Secondary malignant neoplasm of liver and intrahepatic bile duct: Secondary | ICD-10-CM

## 2017-07-04 DIAGNOSIS — R918 Other nonspecific abnormal finding of lung field: Secondary | ICD-10-CM

## 2017-07-04 DIAGNOSIS — E43 Unspecified severe protein-calorie malnutrition: Secondary | ICD-10-CM

## 2017-07-04 DIAGNOSIS — C7951 Secondary malignant neoplasm of bone: Secondary | ICD-10-CM

## 2017-07-04 DIAGNOSIS — R410 Disorientation, unspecified: Secondary | ICD-10-CM

## 2017-07-04 DIAGNOSIS — D696 Thrombocytopenia, unspecified: Secondary | ICD-10-CM

## 2017-07-04 DIAGNOSIS — R5383 Other fatigue: Secondary | ICD-10-CM

## 2017-07-04 DIAGNOSIS — C801 Malignant (primary) neoplasm, unspecified: Secondary | ICD-10-CM

## 2017-07-04 LAB — CBC
HCT: 39.8 % — ABNORMAL LOW (ref 40.0–52.0)
Hemoglobin: 13.3 g/dL (ref 13.0–18.0)
MCH: 27 pg (ref 26.0–34.0)
MCHC: 33.4 g/dL (ref 32.0–36.0)
MCV: 81 fL (ref 80.0–100.0)
PLATELETS: 98 10*3/uL — AB (ref 150–440)
RBC: 4.91 MIL/uL (ref 4.40–5.90)
RDW: 13.7 % (ref 11.5–14.5)
WBC: 15.7 10*3/uL — ABNORMAL HIGH (ref 3.8–10.6)

## 2017-07-04 LAB — BASIC METABOLIC PANEL
Anion gap: 11 (ref 5–15)
BUN: 68 mg/dL — AB (ref 6–20)
CHLORIDE: 109 mmol/L (ref 101–111)
CO2: 31 mmol/L (ref 22–32)
CREATININE: 1.61 mg/dL — AB (ref 0.61–1.24)
Calcium: 14.4 mg/dL (ref 8.9–10.3)
GFR calc Af Amer: 48 mL/min — ABNORMAL LOW (ref >60)
GFR calc non Af Amer: 41 mL/min — ABNORMAL LOW (ref >60)
GLUCOSE: 84 mg/dL (ref 65–99)
POTASSIUM: 4 mmol/L (ref 3.5–5.1)
Sodium: 151 mmol/L — ABNORMAL HIGH (ref 135–145)

## 2017-07-04 LAB — LACTIC ACID, PLASMA: Lactic Acid, Venous: 2 mmol/L (ref 0.5–1.9)

## 2017-07-04 LAB — TROPONIN I
Troponin I: 0.09 ng/mL (ref <0.03)
Troponin I: 0.09 ng/mL (ref <0.03)

## 2017-07-04 MED ORDER — KETOROLAC TROMETHAMINE 30 MG/ML IJ SOLN
15.0000 mg | Freq: Four times a day (QID) | INTRAMUSCULAR | Status: DC | PRN
  Administered 2017-07-04 – 2017-07-06 (×4): 15 mg via INTRAVENOUS
  Filled 2017-07-04 (×4): qty 1

## 2017-07-04 MED ORDER — METOPROLOL TARTRATE 5 MG/5ML IV SOLN
5.0000 mg | Freq: Four times a day (QID) | INTRAVENOUS | Status: DC | PRN
  Administered 2017-07-04 – 2017-07-05 (×2): 5 mg via INTRAVENOUS
  Filled 2017-07-04 (×2): qty 5

## 2017-07-04 MED ORDER — MORPHINE SULFATE (PF) 2 MG/ML IV SOLN
2.0000 mg | INTRAVENOUS | Status: DC | PRN
  Administered 2017-07-04 – 2017-07-06 (×7): 2 mg via INTRAVENOUS
  Filled 2017-07-04 (×7): qty 1

## 2017-07-04 MED ORDER — HYDRALAZINE HCL 20 MG/ML IJ SOLN
10.0000 mg | Freq: Four times a day (QID) | INTRAMUSCULAR | Status: DC | PRN
  Administered 2017-07-04: 11:00:00 10 mg via INTRAVENOUS
  Filled 2017-07-04: qty 1

## 2017-07-04 MED ORDER — AMLODIPINE BESYLATE 5 MG PO TABS: 5.0000 mg | ORAL_TABLET | Freq: Every day | ORAL | Status: DC

## 2017-07-04 MED ORDER — KETOROLAC TROMETHAMINE 15 MG/ML IJ SOLN
15.0000 mg | Freq: Four times a day (QID) | INTRAMUSCULAR | Status: DC | PRN
  Administered 2017-07-04: 15:00:00 15 mg via INTRAVENOUS
  Filled 2017-07-04 (×2): qty 1

## 2017-07-04 NOTE — Progress Notes (Signed)
Family Meeting Note  Advance Directive:no  Today a meeting took place with the spouse.  Patient is unable to participate due [id number] capacity lethargic   The following clinical team members were present during this meeting:MD  The following were discussed:Patient's diagnosis: Lung cancer with mets, hypercalcemia, Patient's progosis: < 12 months and Goals for treatment: DNR  Additional follow-up to be provided: Oncology, palliative care  Time spent during discussion:20 minutes  Vaughan Basta, MD

## 2017-07-04 NOTE — Progress Notes (Signed)
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  INTERVAL HISTORY: Patient remains confused, lethargic, and difficult to arouse.  Family at bedside.  Calcium levels improving.  REVIEW OF SYSTEMS:   Review of Systems  Unable to perform ROS: Acuity of condition    PAST MEDICAL HISTORY: Past Medical History:  Diagnosis Date  . Arthritis   . BPH (benign prostatic hyperplasia) 12/21/2016  . Cancer ([place])   . Hypertension   . Smoker     PAST SURGICAL HISTORY: Past Surgical History:  Procedure Laterality Date  . CIRCUMCISION  12/22/2011   Procedure: CIRCUMCISION ADULT;  Surgeon: Hanley Ben, MD;  Location: [facility];  Service: Urology;  Laterality: N/A;  . HYDROCELE EXCISION  12/22/2011   Procedure: HYDROCELECTOMY ADULT;  Surgeon: Hanley Ben, MD;  Location: [place];  Service: Urology;  Laterality: Left;  350 mls drained   . ORIF ELBOW FRACTURE    . rt hip pinning      FAMILY HISTORY: Family History  Problem Relation Age of Onset  . Diabetes Mother   . Prostate cancer Father   . Lung cancer Sister   . Cancer Sister     ADVANCED DIRECTIVES (Y/N):  @ADVDIR @  HEALTH MAINTENANCE: Social History   Tobacco Use  . Smoking status: Former Smoker    Packs/day: 0.25    Years: 50.00    Pack years: 12.50    Types: Cigarettes    Last attempt to quit: 06/13/2017    Years since quitting: 0.0  . Smokeless tobacco: Never Used  Substance Use Topics  . Alcohol use: No  . Drug use: Never     Colonoscopy:  PAP:  Bone density:  Lipid panel:  No Known Allergies  Current Facility-Administered Medications  Medication  Dose Route Frequency Provider Last Rate Last Dose  . 0.9 %  sodium chloride infusion   Intravenous Continuous Dustin Flock, MD 150 mL/hr at 07/04/17 0805    . acetaminophen (TYLENOL) tablet 650 mg  650 mg Oral Q6H PRN Dustin Flock, MD       Or  . acetaminophen (TYLENOL) suppository 650 mg  650 mg Rectal Q6H PRN Dustin Flock, MD      . enoxaparin (LOVENOX) injection 40 mg  40 mg Subcutaneous Q24H Dustin Flock, MD   40 mg at 06/18/2017 2308  . fluconazole (DIFLUCAN) IVPB 100 mg  100 mg Intravenous Q24H Dustin Flock, MD   Stopped at 07/09/2017 2340  . hydrALAZINE (APRESOLINE) injection 10 mg  10 mg Intravenous Q6H PRN Vaughan Basta, MD   10 mg at 07/04/17 1108  . ketorolac (TORADOL) 15 MG/ML injection 15 mg  15 mg Intravenous Q6H PRN Dustin Flock, MD   15 mg at 07/04/17 0800  . morphine 2 MG/ML injection 2 mg  2 mg Intravenous Q4H PRN Vaughan Basta, MD   2 mg at 07/04/17 1147  . ondansetron (ZOFRAN) tablet 4 mg  4 mg Oral Q6H PRN Dustin Flock, MD       Or  . ondansetron [facility]) injection 4 mg  4 mg Intravenous Q6H PRN Dustin Flock, MD        OBJECTIVE: Vitals:   07/04/17 0503 07/04/17 1056  BP: (!) 159/99 Marland Kitchen)  137/123  Pulse: (!) 116 (!) 123  Resp: 18   Temp: (!) 96.8 F (36 C)   SpO2: 97%      Body mass index is 21.11 kg/m.    ECOG FS:4 - Bedbound  General: Thin, ill-appearing.  Less distressed improved pain control. Eyes: Pink conjunctiva, anicteric sclera. HEENT: Normocephalic, moist mucous membranes, clear oropharnyx. Lungs: Clear to auscultation bilaterally. Heart: Regular rate and rhythm. No rubs, murmurs, or gallops. Abdomen: Soft, nontender, nondistended. No organomegaly noted, normoactive bowel sounds. Musculoskeletal: No edema, cyanosis, or clubbing. Neuro: Confused, lethargic.   Skin: No rashes or petechiae noted.  LAB RESULTS:  Lab Results  Component Value Date   NA 151 (H) 07/04/2017   K 4.0 07/04/2017   CL 109 07/04/2017    CO2 31 07/04/2017   GLUCOSE 84 07/04/2017   BUN 68 (H) 07/04/2017   CREATININE 1.61 (H) 07/04/2017   CALCIUM 14.4 (HH) 07/04/2017   PROT 6.8 07/08/2017   ALBUMIN 3.1 (L) 06/23/2017   AST 223 (H) 06/29/2017   ALT 249 (H) 07/11/2017   ALKPHOS 563 (H) 06/17/2017   BILITOT 4.0 (H) 06/13/2017   GFRNONAA 41 (L) 07/04/2017   GFRAA 48 (L) 07/04/2017    Lab Results  Component Value Date   WBC 15.7 (H) 07/04/2017   NEUTROABS 6.1 06/21/2017   HGB 13.3 07/04/2017   HCT 39.8 (L) 07/04/2017   MCV 81.0 07/04/2017   PLT 98 (L) 07/04/2017     STUDIES: Dg Chest 2 View  Result Date: 06/21/2017 CLINICAL DATA:  Dyspnea and mid chest pain. EXAM: CHEST - 2 VIEW COMPARISON:  Studies dating back through 01/14/2010 FINDINGS: The heart is top-normal in size and stable. Slight increase in pulmonary vascular congestion since prior. Right lower paratracheal soft tissue prominence is new since prior comparisons dating back through 2011. This may simply reflect engorgement of the azygos vein however adenopathy is not excluded. There is uncoiling of the thoracic aorta with atherosclerosis. Subtle opacity projecting over the anterior right first rib may simply reflect a summation of overlapping ribs and vessels. No definite underlying pulmonary consolidation or mass is seen in two orthogonal views. IMPRESSION: 1. Right paratracheal soft tissue prominence may reflect an engorged azygos vein. Adenopathy is not excluded. CT with IV contrast may help for further correlation. 2. Subtle opacity in the right upper lobe overlying the anterior first rib likely reflects a summation shadow. No definite mass or pulmonary consolidation is seen on two views. This too can be further assessed on chest CT. 3. Aortic atherosclerosis. 4. Mild pulmonary vascular congestion. Electronically Signed   By: Ashley Royalty M.D.   On: 06/21/2017 20:13   Ct Head Wo Contrast  Result Date: 06/25/2017 CLINICAL DATA:  Altered level of consciousness.  Patient presents from cancer center with abnormal labs, pain and confusion. History of bone cancer. EXAM: CT HEAD WITHOUT CONTRAST TECHNIQUE: Contiguous axial images were obtained from the base of the skull through the vertex without intravenous contrast. COMPARISON:  None. FINDINGS: Brain: Atrophy with remote appearing small vessel ischemia. No intra-axial mass nor extra-axial fluid collections. Midline fourth ventricle and basal cisterns without effacement. Vascular: No hyperdense vessel sign. Skull: No aggressive lytic or blastic disease.  No fracture. Sinuses/Orbits: Clear mastoids and paranasal sinuses. Intact orbits and globes. Other: None IMPRESSION: Atrophy with chronic appearing small vessel ischemia. No acute intracranial abnormality. Electronically Signed   By: Ashley Royalty M.D.   On: 06/26/2017 17:35   Ct Chest W Contrast  Result Date: 06/25/2017  CLINICAL DATA:  New diagnosis of widespread metastatic disease to the liver and bones on recent CT abdomen/pelvis study performed for weight loss and abdominal pain. EXAM: CT CHEST WITH CONTRAST TECHNIQUE: Multidetector CT imaging of the chest was performed during intravenous contrast administration. CONTRAST:  83mL ISOVUE-300 IOPAMIDOL (ISOVUE-300) INJECTION 61% COMPARISON:  06/21/2017 CT abdomen/pelvis. 06/21/2017 chest radiograph. FINDINGS: Cardiovascular: Top-normal heart size. No significant pericardial effusion/thickening. Ectatic 4.0 cm ascending thoracic aorta. Normal caliber pulmonary arteries. No central pulmonary emboli. Mediastinum/Nodes: Suspected hypodense 1.7 cm left thyroid lobe nodule, poorly delineated on this scan due to streak artifact from dense left subclavian vein iodinated contrast. Unremarkable esophagus. No axillary adenopathy. Enlarged 1.4 cm right supraclavicular node (series 2/image 17). Enlarged 2.0 cm prevascular right mediastinal node with mass-effect on the left brachiocephalic vein (series 2/image 53). Multiple enlarged right  paratracheal nodes, largest 3.0 cm (series 2/image 60). No hilar adenopathy. Lungs/Pleura: No pneumothorax. No pleural effusion. Irregular solid right upper lobe lung mass measuring 2.2 x 2.0 x 3.9 cm (series 3/image 30). Hypoventilatory changes in dependent lower lobes. No additional significant pulmonary nodules. Mild centrilobular emphysema. Upper abdomen: Redemonstration innumerable low-attenuation liver masses scattered throughout the visualized liver, better delineated on the recent CT abdomen study. Stable 2.2 cm left adrenal mass. Musculoskeletal: Innumerable small lytic osseous lesions throughout the thoracic skeleton, most prominent in thoracic vertebral bodies, for example at T6 (series 6/image 80). Mild thoracic spondylosis. IMPRESSION: 1. Irregular solid right upper lobe 2.2 x 2.0 x 3.9 cm lung mass, most compatible with primary bronchogenic carcinoma. 2. Bulky right paratracheal and right prevascular mediastinal adenopathy. Mild right supraclavicular adenopathy. 3. Innumerable small lytic osseous lesions throughout the thoracic skeleton suspicious for widespread osseous metastatic disease. 4. Redemonstration of innumerable liver metastases and left adrenal mass suspicious for adrenal metastasis. 5. Ectatic 4.0 cm ascending thoracic aorta. Recommend annual imaging followup by CTA or MRA. This recommendation follows 2010 ACCF/AHA/AATS/ACR/ASA/SCA/SCAI/SIR/STS/SVM Guidelines for the Diagnosis and Management of Patients with Thoracic Aortic Disease. Circulation. 2010; 121: H702-O378. Aortic Atherosclerosis (ICD10-I70.0) and Emphysema (ICD10-J43.9). These results will be called to the ordering clinician or representative by the Radiology Department at the imaging location. Electronically Signed   By: Ilona Sorrel M.D.   On: 06/25/2017 15:52   Ct Abdomen Pelvis W Contrast  Result Date: 06/21/2017 CLINICAL DATA:  Weight loss and abdominal pain. EXAM: CT ABDOMEN AND PELVIS WITH CONTRAST TECHNIQUE:  Multidetector CT imaging of the abdomen and pelvis was performed using the standard protocol following bolus administration of intravenous contrast. CONTRAST:  170mL ISOVUE-300 IOPAMIDOL (ISOVUE-300) INJECTION 61% COMPARISON:  None. FINDINGS: Lower chest: Subsegmental atelectasis noted in the dependent lung bases. Hepatobiliary: Innumerable liver lesions are compatible with metastatic disease. These range in size from 7 mm up to a dominant anterior right hepatic lesion measuring 4.1 cm (image 9 / series 2). Cholesterol stones are identified in the nondistended gallbladder. No intrahepatic or extrahepatic biliary dilation. Pancreas: No focal mass lesion. No dilatation of the main duct. No intraparenchymal cyst. No peripancreatic edema. Spleen: No splenomegaly. No focal mass lesion. Adrenals/Urinary Tract: No right adrenal nodule or mass. 1.9 cm heterogeneous left adrenal nodule identified. Right kidney unremarkable. 4.9 cm simple cyst identified lower pole left kidney No evidence for hydroureter. The urinary bladder appears normal for the degree of distention. Stomach/Bowel: Stomach is nondistended. No gastric wall thickening. No evidence of outlet obstruction. Duodenum is normally positioned as is the ligament of Treitz. No small bowel wall thickening. No small bowel dilatation. The terminal ileum is normal.  The appendix is normal. No gross colonic mass. No colonic wall thickening. Mild diverticular changes noted in the right colon. Vascular/Lymphatic: There is abdominal aortic atherosclerosis without aneurysm. There is no gastrohepatic or hepatoduodenal ligament lymphadenopathy. No intraperitoneal or retroperitoneal lymphadenopathy. No pelvic sidewall lymphadenopathy. Reproductive: Prostate gland is enlarged. Other: No intraperitoneal free fluid. Musculoskeletal: Innumerable tiny lucent foci are identified in the thoracolumbar spine and bony pelvis, raising concern for metastatic disease. 1 of the larger lesions is  seen in the posterior right iliac bone measuring 11 mm on image 51/series 2. Small right groin hernia contains only fat. IMPRESSION: 1. Innumerable liver lesions compatible with metastatic disease. 19 mm left adrenal nodule is concerning for metastatic involvement and there are innumerable lucent bone lesions also highly suggestive of metastatic disease. No primary malignancy is identified in the abdomen or pelvis on today's study. Imaging of the chest may prove helpful to assess for lung primary given the patient's smoking history. 2. Prostatomegaly. 3.  Aortic Atherosclerois (ICD10-170.0) Electronically Signed   By: Misty Stanley M.D.   On: 06/21/2017 19:17   Dg Chest Portable 1 View  Result Date: 06/22/2017 CLINICAL DATA:  Altered level of consciousness.  Bone cancer. EXAM: PORTABLE CHEST 1 VIEW COMPARISON:  None. FINDINGS: The heart size and mediastinal contours are within normal limits. Tortuous thoracic aorta without aneurysm. Right paratracheal soft tissue prominence may reflect tortuous great vessels or potentially mediastinal adenopathy. Both lungs are clear. Nonspecific lytic appearance of the proximal left humeral shaft. IMPRESSION: 1. Tortuous thoracic aorta. 2. Right paratracheal soft tissue prominence is noted which cannot exclude mediastinal adenopathy though other etiologies such as tortuous great vessels or prominent mediastinal fat may also present in such fashion. 3. No active pulmonary disease. 4. Irregular lytic lucencies noted of the proximal left humeral shaft may reflect stigmata of the patient's reported bone cancer. Electronically Signed   By: Ashley Royalty M.D.   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  PLAN:    1. 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  Patient will require  ultrasound-guided biopsy of one of his liver lesions as soon as his confusion improves.  Hopefully this can be completed by the end of the week.  Noncontrast CT of the head did not reveal metastatic disease.   2.  Hypercalcemia: Improving with IV fluids and Zometa.  Likely secondary to underlying malignancy.    Continue to monitor daily basic metabolic panel.   3.  Pain: Patient appears more comfortable today.  Continue morphine and Toradol as needed.   4.  Thrombocytopenia: Likely secondary to underlying malignancy.  Monitor. 5.  Confusion: Likely related to his hypercalcemia. 6.  Hyperbilirubinemia: Likely secondary to extensive malignancy in his liver.  Biopsy as above.  Will follow.   Lloyd Huger, MD   07/04/2017 12:36 PM

## 2017-07-04 NOTE — Progress Notes (Signed)
SWOT nurse assessment of MEWS 3. Patient with SBP 150's HR 122 s/p hydralazine administration. Per RN patient too lethargic for PO norvasc this am. MD notified and RN aware. IVF at 111ml/hr. Patient also noted with tortuous thoracic aorta on xray.

## 2017-07-04 NOTE — Progress Notes (Signed)
[place] [facility] [place] NAME: Joseph Hunt    MR#:  [id number]  DATE OF BIRTH:  03/20/1945  SUBJECTIVE:  CHIEF COMPLAINT:   Chief Complaint  Patient presents with  . 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 Brought lethargic with hypercalcemia. He remains the same today. His wife is in the room. REVIEW OF SYSTEMS:   Can not give ROS due to Hypercalcemia and confusion.  ROS  DRUG ALLERGIES:  No Known Allergies  VITALS:  Blood pressure (!) 172/99, pulse (!) 101, temperature 97.7 F (36.5 C), temperature source Oral, resp. rate 20, height 6\' 1"  (1.854 m), weight 72.6 kg (160 lb), SpO2 95 %.  PHYSICAL EXAMINATION:  GENERAL:  72 y.o.-year-old malnourished patient lying in the bed with no acute distress.  EYES: Pupils equal, round, reactive to light and accommodation. No scleral icterus. Extraocular muscles intact.  HEENT: Head atraumatic, normocephalic. Oropharynx and nasopharynx clear.  NECK:  Supple, no jugular venous distention. No thyroid enlargement, no tenderness.  LUNGS: Normal breath sounds bilaterally, no wheezing, rales,rhonchi or crepitation. No use of accessory muscles of respiration.  CARDIOVASCULAR: S1, S2 normal. No murmurs, rubs, or gallops.  ABDOMEN: Soft, nontender, nondistended. Bowel sounds present. No organomegaly or mass.  EXTREMITIES: No pedal edema, cyanosis, or clubbing.  NEUROLOGIC: patient is lethargic, arousable, does not follow, and but moves limbs slightly to painful stimuli. PSYCHIATRIC: The patient is lethargic.  SKIN: No obvious rash, lesion, or ulcer.   Physical Exam LABORATORY PANEL:   CBC Recent Labs  Lab 07/04/17 0319  WBC 15.7*  HGB 13.3  HCT 39.8*  PLT 98*   ------------------------------------------------------------------------------------------------------------------  Chemistries  Recent Labs  Lab 07/01/2017 1652 07/04/17 0319  NA 148* 151*   K 3.9 4.0  CL 106 109  CO2 31 31  GLUCOSE 116* 84  BUN 60* 68*  CREATININE 1.63* 1.61*  CALCIUM 15.0* 14.4*  AST 223*  --   ALT 249*  --   ALKPHOS 563*  --   BILITOT 4.0*  --    ------------------------------------------------------------------------------------------------------------------  Cardiac Enzymes Recent Labs  Lab 07/04/17 0319 07/04/17 0800  TROPONINI 0.09* 0.09*   ------------------------------------------------------------------------------------------------------------------  RADIOLOGY:  Ct Head Wo Contrast  Result Date: 07/01/2017 CLINICAL DATA:  Altered level of consciousness. Patient presents from cancer center with abnormal labs, pain and confusion. History of bone cancer. EXAM: CT HEAD WITHOUT CONTRAST TECHNIQUE: Contiguous axial images were obtained from the base of the skull through the vertex without intravenous contrast. COMPARISON:  None. FINDINGS: Brain: Atrophy with remote appearing small vessel ischemia. No intra-axial mass nor extra-axial fluid collections. Midline fourth ventricle and basal cisterns without effacement. Vascular: No hyperdense vessel sign. Skull: No aggressive lytic or blastic disease.  No fracture. Sinuses/Orbits: Clear mastoids and paranasal sinuses. Intact orbits and globes. Other: None IMPRESSION: Atrophy with chronic appearing small vessel ischemia. No acute intracranial abnormality. Electronically Signed   By: Ashley Royalty M.D.   On: 06/23/2017 17:35   Dg Chest Portable 1 View  Result Date: 06/30/2017 CLINICAL DATA:  Altered level of consciousness.  Bone cancer. EXAM: PORTABLE CHEST 1 VIEW COMPARISON:  None. FINDINGS: The heart size and mediastinal contours are within normal limits. Tortuous thoracic aorta without aneurysm. Right paratracheal soft tissue prominence may reflect tortuous great vessels or potentially mediastinal adenopathy. Both lungs are clear. Nonspecific lytic appearance of the proximal left humeral shaft. IMPRESSION:  1. Tortuous thoracic aorta. 2. Right paratracheal soft  tissue prominence is noted which cannot exclude mediastinal adenopathy though other etiologies such as tortuous great vessels or prominent mediastinal fat may also present in such fashion. 3. No active pulmonary disease. 4. Irregular lytic lucencies noted of the proximal left humeral shaft may reflect stigmata of the patient's reported bone cancer. Electronically Signed   By: Ashley Royalty M.D.   On: 06/28/2017 17:39    ASSESSMENT AND PLAN:   Active Problems:   Acute encephalopathy   Protein-calorie malnutrition, severe  Patient is a 72 year old African-American male presenting with altered mental status noted to have severe hypercalcemia  1.  Acute encephalopathy suspect this is due to severe hypercalcemia and dehydration. aggressive IV fluids follow calcium levels   Given Zometa by Oncology.  2.  Hypercalcemia related to metastatic malignancy to bone Patient has received a dose of Zometa,  IV fluids we will recheck ionized calcium in the morning- still high, cont to follow.  3.  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  oncology recommends MRI of the brain.  4.  Oral thrush Unable to do nystatin swish and swallow Given dose of IV fluconazole  5.  Leukocytosis no evidence of infection will monitor for fevers hold off on any antibiotics now  6.  Miscellaneous Lovenox for DVT prophylaxis  7. Pain- can not take oral- give morphin IV  All the records are reviewed and case discussed with Care Management/Social Workerr. Management plans discussed with the patient, family and they are in agreement.  CODE STATUS: DNR.  TOTAL TIME TAKING CARE OF THIS PATIENT: 40 minutes.   Discussed with patient's wife in the room.  POSSIBLE D/C IN *1-2 DAYS, DEPENDING ON CLINICAL  CONDITION.   Vaughan Basta M.D on 07/04/2017   Between 7am to 6pm - Pager - [phone]  After 6pm go to [email or web address] - password [email or web address]  Office  [phone]  CC: Primary care physician; Biagio Borg, MD  Note: This dictation was prepared with Dragon dictation along with smaller phrase technology. Any transcriptional errors that result from this process are unintentional.

## 2017-07-04 NOTE — Discharge Instructions (Signed)

## 2017-07-04 NOTE — Plan of Care (Signed)

## 2017-07-04 NOTE — Care Management (Signed)
Patient sent to ED from [facility] for abnormal labs.  Calcium was significantly elevated.  Patient has very recently found to have lung mass with evidence of metastasis to bone and liver.  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

## 2017-07-04 NOTE — Progress Notes (Signed)
Patient with persistent HTN s/p Metoprolol 5mg  IV administration. IV Hydralazine available at this time. Suggestion to RN to administer. IVF placed on hold for diminished lung sounds and MD paged. Awaiting return call.

## 2017-07-04 NOTE — Progress Notes (Addendum)
Initial Nutrition Assessment  DOCUMENTATION CODES:   Severe malnutrition in context of chronic illness  INTERVENTION:   - Once diet advance, recommend Ensure Enlive po TID, each supplement provides 350 kcal and 20 grams of protein  - Magic cup TID with meals, each supplement provides 290 kcal and 9 grams of protein  NUTRITION DIAGNOSIS:   Severe Malnutrition related to cancer and cancer related treatments, chronic illness(lung cancer with mets to liver and bone) as evidenced by moderate fat depletion, severe fat depletion, mild muscle depletion, moderate muscle depletion, severe muscle depletion, percent weight loss(12% weight loss in less than 1 month).  GOAL:   Patient will meet greater than or equal to 90% of their needs  MONITOR:   Diet advancement, Labs, Weight trends, PO intake, Supplement acceptance  REASON FOR ASSESSMENT:   Malnutrition Screening Tool    ASSESSMENT:   72 year old male who presented to the ED from the [facility] with hypercalcemia, pain, and AMS. PMH significant for hypertension, BPH, and lung cancer with metastases in liver and bone not currently on chemotherapy or radiation.  Spoke with pt's wife at bedside as pt was sleeping. Per pt's wife, he currently has a poor appetite due to "being sick" but that his appetite is "normally good." Pt's wife reports that pt consumes 3-4 meals daily. 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 Lunch may include a chicken salad from [employer]. Dinner may include something that pt's wife cooks or food from [employer].  Pt's wife reports that pt has lost a significant amount of weight recently. Per pt's wife, pt's UBW is 230-232 lbs and he last weighed this in November 2018. Per weight history in chart, pt has lost 21 lbs in the past month. This corresponds to a 12% weight loss which is severe and  significant for timeframe.  Pt's wife reports that some of pt's family members bought him Ensure this past Sunday and that he was consuming 1-2 daily PTA. Pt's wife agreeable to pt receiving Ensure Enlive TID during current admission. RD to order once pt's diet is advanced.  Addendum: Spoke with RN. Unsure when pt's diet will be advanced given extreme lethargy.  Medications reviewed.  Labs reviewed: BUN 68 (H), creatinine 1.61 (H), calcium 14.4 (H), elevated ALT and AST  NUTRITION - FOCUSED PHYSICAL EXAM:    Most Recent Value  Orbital Region  Moderate depletion  Upper Arm Region  Severe depletion  Thoracic and Lumbar Region  Severe depletion  Buccal Region  Severe depletion  Temple Region  Moderate depletion  Clavicle Bone Region  Moderate depletion  Clavicle and Acromion Bone Region  Severe depletion  Scapular Bone Region  Severe depletion  Dorsal Hand  Mild depletion  Patellar Region  Severe depletion  Anterior Thigh Region  Severe depletion  Posterior Calf Region  Moderate depletion  Edema (RD Assessment)  None  Hair  Reviewed  Eyes  Unable to assess  Mouth  Unable to assess  Skin  Reviewed  Nails  Reviewed       Diet Order:   Diet Order           Diet NPO time specified Except for: Sips with Meds  Diet effective now          EDUCATION NEEDS:   No education needs have been identified at this time  Skin:  Skin Assessment: Reviewed RN Assessment  Last BM:  unknown/PTA  Height:   Ht Readings from Last 1 Encounters:  06/26/2017 6\' 1"  (1.854 m)    Weight:   Wt Readings from Last 1 Encounters:  06/15/2017 160 lb (72.6 kg)    Ideal Body Weight:  83.6 kg  BMI:  Body mass index is 21.11 kg/m.  Estimated Nutritional Needs:   Kcal:  2300-2500 kcal/day (32-35 kcal/kg)  Protein:  110-125 grams/day  Fluid:  >/= 2.2 L/day    Gaynell Face, MS, RD, LDN Pager: [phone] Weekend/After Hours: [phone]

## 2017-07-04 NOTE — Progress Notes (Signed)
  Oncology Nurse Navigator Documentation  Navigator Location: [facility]Med Onc (06/28/2017 1600) Referral date to RadOnc/MedOnc: 07/02/17 (07/10/2017 1600) )Navigator Encounter Type: Initial MedOnc (07/02/2017 1600)   Abnormal Finding Date: 06/21/17 (06/25/2017 1600)                   Treatment Phase: Abnormal Scans (06/15/2017 1600) Barriers/Navigation Needs: Coordination of Care (06/27/2017 1600)   Interventions: Coordination of Care (06/23/2017 1600)   Coordination of Care: Appts (07/02/2017 1600)        Acuity: Level 2 (07/13/2017 1600)   Acuity Level 2: Educational needs;Assistance expediting appointments;Ongoing guidance and education throughout treatment as needed (07/02/2017 1600)  met with patient during initial med-onc consultation with Dr. Grayland Ormond. All questions answered at the time of visit. Pt appeared very comfortable with labored breathing during consultation. Dr. Grayland Ormond discussed with family need to admit patient for pain management and further workup regarding lung mass with liver, bone mets. Reassurance provided. Family in agreement with plan. Assisted coordinating admission and transport to ED to start receiving pain meds/IVF. Contact info given to pt's son and wife and encouraged to call with any further questions or needs. Understanding verbalized. Nothing further needed at this time.    Time Spent with Patient: 60 (06/14/2017 1600)

## 2017-07-05 DIAGNOSIS — Z7189 Other specified counseling: Secondary | ICD-10-CM

## 2017-07-05 DIAGNOSIS — E43 Unspecified severe protein-calorie malnutrition: Secondary | ICD-10-CM

## 2017-07-05 DIAGNOSIS — Z515 Encounter for palliative care: Secondary | ICD-10-CM

## 2017-07-05 DIAGNOSIS — G9340 Encephalopathy, unspecified: Secondary | ICD-10-CM

## 2017-07-05 DIAGNOSIS — Z66 Do not resuscitate: Secondary | ICD-10-CM

## 2017-07-05 DIAGNOSIS — R4182 Altered mental status, unspecified: Secondary | ICD-10-CM

## 2017-07-05 DIAGNOSIS — K769 Liver disease, unspecified: Secondary | ICD-10-CM

## 2017-07-05 LAB — BASIC METABOLIC PANEL
ANION GAP: 12 (ref 5–15)
BUN: 91 mg/dL — ABNORMAL HIGH (ref 6–20)
CALCIUM: 13 mg/dL — AB (ref 8.9–10.3)
CO2: 28 mmol/L (ref 22–32)
Chloride: 116 mmol/L — ABNORMAL HIGH (ref 101–111)
Creatinine, Ser: 1.99 mg/dL — ABNORMAL HIGH (ref 0.61–1.24)
GFR, EST AFRICAN AMERICAN: 37 mL/min — AB (ref >60)
GFR, EST NON AFRICAN AMERICAN: 32 mL/min — AB (ref >60)
GLUCOSE: 67 mg/dL (ref 65–99)
POTASSIUM: 3.8 mmol/L (ref 3.5–5.1)
Sodium: 156 mmol/L — ABNORMAL HIGH (ref 135–145)

## 2017-07-05 LAB — URINE CULTURE: Culture: NO GROWTH

## 2017-07-05 LAB — CALCIUM, IONIZED: Calcium, Ionized, Serum: 8.7 mg/dL — ABNORMAL HIGH (ref 4.5–5.6)

## 2017-07-05 MED ORDER — DEXTROSE-NACL 5-0.45 % IV SOLN
INTRAVENOUS | Status: DC
  Administered 2017-07-05 (×2): via INTRAVENOUS

## 2017-07-05 MED ORDER — METOPROLOL TARTRATE 5 MG/5ML IV SOLN
5.0000 mg | Freq: Four times a day (QID) | INTRAVENOUS | Status: DC
  Administered 2017-07-05 – 2017-07-06 (×5): 5 mg via INTRAVENOUS
  Filled 2017-07-05 (×5): qty 5

## 2017-07-05 MED ORDER — SODIUM CHLORIDE 0.9 % IV SOLN: INTRAVENOUS | Status: DC

## 2017-07-05 NOTE — Consult Note (Addendum)
Consultation Note Date: 07/05/2017   Patient Name: Bocephus Cali  DOB: 11-25-1945  MRN: [id number]  Age / Sex: 72 y.o., male  PCP: Biagio Borg, MD Referring Physician: Vaughan Basta, *  Reason for Consultation: Establishing goals of care  HPI/Patient Profile: 72 y.o. male  admitted on 07/06/2017 via wheelchair from the cancer center with abnormal labs, pain, and altered mental status. He has a past medical history of arthritis, hypertension, smoker, and BPH. Patient presented to [facility] emergency room approximately 10 to 11 days ago with worsening back pain radiating down his legs.  Patient was found to have widely metastatic disease in his bones and liver with a primary lung mass.  A referral was made to the [facility] center, but patient's appointment was not scheduled until next week.  Since his emergency room visit, he has rapidly declined to the point where he is confused and cannot offer review of systems.  His family reports that 2 weeks ago he was playing football in the yard with his family.  He now has minimal PO intake, constant pain, and nearly bedridden.  Patient son also reports a 40-50 pound weight loss over the past several months.  Patient is clearly uncomfortable and confused. Since admission he has been seen by oncology who has scheduled a liver biopsy. He is receiving Zometa for hypercalcemia. Palliative Medicine consulted for goals of care, symptom management, and code status.   Clinical Assessment and Goals of Care: I have reviewed medical records including lab results, imaging, Epic notes, and MAR, received report from the bedside RN, and assessed the patient. I then met at the bedside with wife, Fraser Din, son, Valarie Merino, and daughter, Maudie Mercury to discuss diagnosis prognosis, [place], EOL wishes, disposition and options. Patient is lying in bed, lethargic, with disorientation on arousal. He  is not appropriate to participate in goals of care discussion.   I introduced Palliative Medicine as specialized medical care for people living with serious illness. It focuses on providing relief from the symptoms and stress of a serious illness. The goal is to improve quality of life for both the patient and the family.  We discussed a brief life review of the patient. Family states Mr. Bifulco is a very active and independent man. 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 He has 2 children and 4 grandkids. He loved football and spending time with his family. He is a man of [place]. He and his wife Fraser Din have been married 11 years.   As far as functional and nutritional status the family states Mr. Ostermiller was a very active man. He actually was playing football with his son and grandson 2 weeks ago in the yard. He loved doing yard work and the Sunday prior to his admission he landscaped and mowed 2 yards in the community. Family reports he has always had an hefty appetite and often ate seconds up until a month ago. Family then noticed his appetite decreased. He would only eat a small amount of  a regular serving, no longer eat hefty portions or seconds, and they noticed he had began to lose weight. Over the course of 1-2 months family feels as though he has loss over 40-50 lbs.  He was independently functioning and very happy man. Family reported he complained for months about hip pain which he referred to as "arthritis or bursitis". The family verbalizes in their heart knowing something was wrong also because he was more fatigued and tired over the past month. He would have to make more frequent breaks or naps.   We discussed his current illness and what it means in the larger context of his on-going co-morbidities.  Natural disease trajectory and expectations at EOL were discussed. The family verbalized awareness that he has a very serious diagnosis and that a cure will not be obtainable.  They were very tearful during conversation. Their was some agitation noted between wife and children.   I attempted to elicit values and goals of care important to the patient.    The difference between aggressive medical intervention and comfort care was considered in light of the patient's goals of care. At this time all family members agreed that they would like to continue with aggressive interventions. Their hopes is he will improve mentally and physically, be able to undergo the liver biopsy, and possibly have chemo if his functional status improves.   Advanced directives, concepts specific to code status, artifical feeding and hydration, and rehospitalization were considered and discussed. We discussed advance directives. At this time family agree to continue DNR/DNI. They are also not interested in artificial feedings such as PEG tube knowing the severity of his cancer.   Hospice and Palliative Care services outpatient were explained and offered. At this time the family would not like to decide on any outpatient services, they would like to continue aggressively and see were the path takes them and would then make a decision.   Questions and concerns were addressed.  The family was encouraged to call with questions or concerns.  PMT will continue to support holistically.  Decision Maker: NEXT OF KIN-Wife, Reyes Ivan.     SUMMARY OF RECOMMENDATIONS    DNR/DNI at family's request  Continue to treat the treatable. Family is hopeful patient will improve to baseline, and can proceed with liver biopsy and possibly chemotherapy. They are aware that improvement is needed in regards to both functional and nutritional status in order to tolerate aggressive treatments (chemo).   Would like to hear prognosis and treatment plan from Dr. Grayland Ormond prior to making any decisions on palliative or hospice.   Agree with Toradol and morphine IV PRN for pain.   Agree with Zofran PRN for nausea.     Palliative Medicine team will continue to support patient, family, and medical team throughout hospitalization.   Code Status/Advance Care Planning:  DNR/DNI at family's request   Palliative Prophylaxis:   Aspiration, Bowel Regimen, Delirium Protocol, Frequent Pain Assessment, Oral Care and Turn Reposition  Additional Recommendations (Limitations, Scope, Preferences):  Full Scope Treatment  Psycho-social/Spiritual:   Desire for further Chaplaincy support:no  Prognosis:   Unable to determine-poor in the setting of hypertension, metastatic right upper lobe lung cancer with widespread mets to liver and bone, hypercalcemia, altered mental status, decreased mobility, poor po intake, and generalized pain.   Discharge Planning: To Be Determined      Primary Diagnoses: Present on Admission: . Acute encephalopathy   I have reviewed the medical record, interviewed the patient and family, and  examined the patient. The following aspects are pertinent.  Past Medical History:  Diagnosis Date  . Arthritis   . BPH (benign prostatic hyperplasia) 12/21/2016  . Cancer ([place])   . Hypertension   . Smoker    Social History   Socioeconomic History  . Marital status: Married    Spouse name: Not on file  . Number of children: Not on file  . Years of education: Not on file  . Highest education level: Not on file  Occupational History  . Not on file  Social Needs  . Financial resource strain: Not on file  . Food insecurity:    Worry: Not on file    Inability: Not on file  . Transportation needs:    Medical: Not on file    Non-medical: Not on file  Tobacco Use  . Smoking status: Former Smoker    Packs/day: 0.25    Years: 50.00    Pack years: 12.50    Types: Cigarettes    Last attempt to quit: 06/13/2017    Years since quitting: 0.0  . Smokeless tobacco: Never Used  Substance and Sexual Activity  . Alcohol use: No  . Drug use: Never  . Sexual activity: Not Currently   Lifestyle  . Physical activity:    Days per week: Not on file    Minutes per session: Not on file  . Stress: Not on file  Relationships  . Social connections:    Talks on phone: Not on file    Gets together: Not on file    Attends religious service: Not on file    Active member of club or organization: Not on file    Attends meetings of clubs or organizations: Not on file    Relationship status: Not on file  Other Topics Concern  . Not on file  Social History Narrative  . Not on file   Family History  Problem Relation Age of Onset  . Diabetes Mother   . Prostate cancer Father   . Lung cancer Sister   . Cancer Sister    Scheduled Meds: . enoxaparin (LOVENOX) injection  40 mg Subcutaneous Q24H  . metoprolol tartrate  5 mg Intravenous Q6H   Continuous Infusions: . dextrose 5 % and 0.45% NaCl 75 mL/hr at 07/05/17 0448  . fluconazole (DIFLUCAN) IV Stopped (07/04/17 2254)   PRN Meds:.acetaminophen **OR** acetaminophen, hydrALAZINE, ketorolac, morphine injection, ondansetron **OR** ondansetron (ZOFRAN) IV Medications Prior to Admission:  Prior to Admission medications   Medication Sig Start Date End Date Taking? Authorizing Provider  amLODipine (NORVASC) 5 MG tablet Take 1 tablet (5 mg total) daily by mouth. 12/21/16  Yes Biagio Borg, MD  morphine (MS CONTIN) 15 MG 12 hr tablet Take 1 tablet (15 mg total) by mouth every 12 (twelve) hours. For chronic malignant pain 06/29/17  Yes Biagio Borg, MD  naproxen sodium (ANAPROX) 220 MG tablet Take 220 mg by mouth as needed (pain).    Yes [provider]  aspirin EC 81 MG tablet Take 1 tablet (81 mg total) daily by mouth. Patient not taking: Reported on 06/22/2017 12/21/16   Biagio Borg, MD   No Known Allergies Review of Systems  Unable to perform ROS: Acuity of condition    Physical Exam  Constitutional: He appears lethargic. He appears cachectic. He appears ill.  Fragile appearance   Eyes:  Sunken   Cardiovascular:  Normal rate, regular rhythm, normal heart sounds, intact distal pulses and normal pulses.  Pulmonary/Chest:  Effort normal. He has decreased breath sounds.  Abdominal: Normal appearance and bowel sounds are normal.  Musculoskeletal:  Weakness   Neurological: He appears lethargic. He displays atrophy.  Psychiatric: Cognition and memory are normal. He expresses inappropriate judgment.  Nursing note and vitals reviewed.   Vital Signs: BP (!) 136/114 (BP Location: Left Arm)   Pulse 98   Temp 98.6 F (37 C)   Resp 18   Ht '6\' 1"'$  (1.854 m)   Wt 72.6 kg (160 lb)   SpO2 97%   BMI 21.11 kg/m  Pain Scale: PAINAD   Pain Score: Asleep   SpO2: SpO2: 97 % O2 Device:SpO2: 97 % O2 Flow Rate: .O2 Flow Rate (L/min): 2 L/min  IO: Intake/output summary:   Intake/Output Summary (Last 24 hours) at 07/05/2017 1410 Last data filed at 07/05/2017 0600 Gross per 24 hour  Intake 3178.75 ml  Output 200 ml  Net 2978.75 ml    LBM: Last BM Date: (unknown) Baseline Weight: Weight: 72.6 kg (160 lb) Most recent weight: Weight: 72.6 kg (160 lb)     Palliative Assessment/Data:PPS 10%   Time In: 1400 Time Out: 1535 Time Total: 95 min.   Greater than 50%  of this time was spent counseling and coordinating care related to the above assessment and plan.  Signed by: Alda Lea, NP-BC Palliative Medicine Team  Phone: [phone] Fax: [phone]   Please contact Palliative Medicine Team phone at [phone] for questions and concerns.  For individual provider: See Shea Evans

## 2017-07-05 NOTE — Progress Notes (Signed)
[facility] NAME: Joseph Hunt    MR#:  [id number]  DATE OF BIRTH:  [birth year]  SUBJECTIVE:  CHIEF COMPLAINT:   Chief Complaint  Patient presents with  . 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 Brought lethargic with hypercalcemia. He is little more awake today. His wife is in the room. REVIEW OF SYSTEMS:   Can not give ROS due to Hypercalcemia and confusion.  ROS  DRUG ALLERGIES:  No Known Allergies  VITALS:  Blood pressure (!) 136/114, pulse 98, temperature 98.6 F (37 C), resp. rate 18, height 6\' 1"  (1.854 m), weight 72.6 kg (160 lb), SpO2 97 %.  PHYSICAL EXAMINATION:  GENERAL:  72 y.o.-year-old malnourished patient lying in the bed with no acute distress.  EYES: Pupils equal, round, reactive to light and accommodation. No scleral icterus. Extraocular muscles intact.  HEENT: Head atraumatic, normocephalic. Oropharynx and nasopharynx clear.  NECK:  Supple, no jugular venous distention. No thyroid enlargement, no tenderness.  LUNGS: Normal breath sounds bilaterally, no wheezing, rales,rhonchi or crepitation. No use of accessory muscles of respiration.  CARDIOVASCULAR: S1, S2 normal. No murmurs, rubs, or gallops.  ABDOMEN: Soft, nontender, nondistended. Bowel sounds present. No organomegaly or mass.  EXTREMITIES: No pedal edema, cyanosis, or clubbing.  NEUROLOGIC: patient is lethargic, arousable, does not follow, and but moves limbs slightly to painful stimuli. PSYCHIATRIC: The patient is lethargic.  SKIN: No obvious rash, lesion, or ulcer.   Physical Exam LABORATORY PANEL:   CBC Recent Labs  Lab 07/04/17 0319  WBC 15.7*  HGB 13.3  HCT 39.8*  PLT 98*   ------------------------------------------------------------------------------------------------------------------  Chemistries  Recent Labs  Lab 07/02/2017 1652  07/05/17 0458  NA 148*   < > 156*  K 3.9   < > 3.8   CL 106   < > 116*  CO2 31   < > 28  GLUCOSE 116*   < > 67  BUN 60*   < > 91*  CREATININE 1.63*   < > 1.99*  CALCIUM 15.0*   < > 13.0*  AST 223*  --   --   ALT 249*  --   --   ALKPHOS 563*  --   --   BILITOT 4.0*  --   --    < > = values in this interval not displayed.   ------------------------------------------------------------------------------------------------------------------  Cardiac Enzymes Recent Labs  Lab 07/04/17 0319 07/04/17 0800  TROPONINI 0.09* 0.09*   ------------------------------------------------------------------------------------------------------------------  RADIOLOGY:  Ct Head Wo Contrast  Result Date: 06/28/2017 CLINICAL DATA:  Altered level of consciousness. Patient presents from cancer center with abnormal labs, pain and confusion. History of bone cancer. EXAM: CT HEAD WITHOUT CONTRAST TECHNIQUE: Contiguous axial images were obtained from the base of the skull through the vertex without intravenous contrast. COMPARISON:  None. FINDINGS: Brain: Atrophy with remote appearing small vessel ischemia. No intra-axial mass nor extra-axial fluid collections. Midline fourth ventricle and basal cisterns without effacement. Vascular: No hyperdense vessel sign. Skull: No aggressive lytic or blastic disease.  No fracture. Sinuses/Orbits: Clear mastoids and paranasal sinuses. Intact orbits and globes. Other: None IMPRESSION: Atrophy with chronic appearing small vessel ischemia. No acute intracranial abnormality. Electronically Signed   By: Ashley Royalty M.D.   On: 07/04/2017 17:35   Dg Chest Portable 1 View  Result Date: 07/06/2017 CLINICAL DATA:  Altered level of consciousness.  Bone cancer. EXAM: PORTABLE CHEST 1 VIEW COMPARISON:  None.  FINDINGS: The heart size and mediastinal contours are within normal limits. Tortuous thoracic aorta without aneurysm. Right paratracheal soft tissue prominence may reflect tortuous great vessels or potentially mediastinal adenopathy. Both  lungs are clear. Nonspecific lytic appearance of the proximal left humeral shaft. IMPRESSION: 1. Tortuous thoracic aorta. 2. Right paratracheal soft tissue prominence is noted which cannot exclude mediastinal adenopathy though other etiologies such as tortuous great vessels or prominent mediastinal fat may also present in such fashion. 3. No active pulmonary disease. 4. Irregular lytic lucencies noted of the proximal left humeral shaft may reflect stigmata of the patient's reported bone cancer. Electronically Signed   By: Ashley Royalty M.D.   On: 06/15/2017 17:39    ASSESSMENT AND PLAN:   Active Problems:   Acute encephalopathy   Protein-calorie malnutrition, severe  Patient is a 72 year old African-American male presenting with altered mental status noted to have severe hypercalcemia  1.  Acute encephalopathy suspect this is due to severe hypercalcemia and dehydration. aggressive IV fluids follow calcium levels   Given Zometa by Oncology.  2.  Hypercalcemia related to metastatic malignancy to bone Patient has received a dose of Zometa,  IV fluids we will recheck ionized calcium in the morning- still high, cont to follow.  3.  Likely metastatic lung cancer oncology recommends biopsy of the liver lesions - I spoke to IR and will hold his lovenox tonight and tentetive plan for tomorrow.   oncology recommends MRI of the brain. Which caan wait until pt is able to follow commands.  4.  Oral thrush Unable to do nystatin swish and swallow Given dose of IV fluconazole  5.  Leukocytosis no evidence of infection will monitor for fevers hold off on any antibiotics now  6.  Miscellaneous Lovenox for DVT prophylaxis  7. Pain- can not take oral- give morphin IV  All the records are reviewed and case discussed with Care Management/Social Workerr. Management plans discussed with the patient, family and they are in agreement.  CODE STATUS: DNR.  TOTAL TIME TAKING CARE OF THIS PATIENT: 32  minutes.   Discussed with patient's wife in the room.  POSSIBLE D/C IN 1-2 DAYS, DEPENDING ON CLINICAL CONDITION.   Vaughan Basta M.D on 07/05/2017   Between 7am to 6pm - Pager - [phone]  After 6pm go to [email or web address] - password [email or web address] [facility]  Office  [phone]  CC: Primary care physician; Biagio Borg, MD  Note: This dictation was prepared with Dragon dictation along with smaller phrase technology. Any transcriptional errors that result from this process are unintentional.

## 2017-07-06 ENCOUNTER — Inpatient Hospital Stay: Payer: Medicare Other

## 2017-07-06 ENCOUNTER — Inpatient Hospital Stay: Payer: Medicare Other | Admitting: Internal Medicine

## 2017-07-06 DIAGNOSIS — R52 Pain, unspecified: Secondary | ICD-10-CM

## 2017-07-06 LAB — BASIC METABOLIC PANEL
Anion gap: 8 (ref 5–15)
BUN: 96 mg/dL — ABNORMAL HIGH (ref 6–20)
CALCIUM: 10.7 mg/dL — AB (ref 8.9–10.3)
CO2: 27 mmol/L (ref 22–32)
Chloride: 121 mmol/L — ABNORMAL HIGH (ref 101–111)
Creatinine, Ser: 2.15 mg/dL — ABNORMAL HIGH (ref 0.61–1.24)
GFR calc non Af Amer: 29 mL/min — ABNORMAL LOW (ref >60)
GFR, EST AFRICAN AMERICAN: 34 mL/min — AB (ref >60)
GLUCOSE: 113 mg/dL — AB (ref 65–99)
Potassium: 3.9 mmol/L (ref 3.5–5.1)
Sodium: 156 mmol/L — ABNORMAL HIGH (ref 135–145)

## 2017-07-06 LAB — CBC
HCT: 39.1 % — ABNORMAL LOW (ref 40.0–52.0)
Hemoglobin: 12.6 g/dL — ABNORMAL LOW (ref 13.0–18.0)
MCH: 26.5 pg (ref 26.0–34.0)
MCHC: 32.2 g/dL (ref 32.0–36.0)
MCV: 82.3 fL (ref 80.0–100.0)
PLATELETS: 73 10*3/uL — AB (ref 150–440)
RBC: 4.75 MIL/uL (ref 4.40–5.90)
RDW: 14.2 % (ref 11.5–14.5)
WBC: 11.8 10*3/uL — ABNORMAL HIGH (ref 3.8–10.6)

## 2017-07-06 LAB — PROTIME-INR
INR: 1.5
PROTHROMBIN TIME: 18 s — AB (ref 11.4–15.2)

## 2017-07-06 LAB — APTT: aPTT: 25 seconds (ref 24–36)

## 2017-07-06 MED ORDER — METOPROLOL TARTRATE 5 MG/5ML IV SOLN
2.5000 mg | Freq: Four times a day (QID) | INTRAVENOUS | Status: DC
  Administered 2017-07-07 (×2): 2.5 mg via INTRAVENOUS
  Filled 2017-07-06 (×2): qty 5

## 2017-07-06 MED ORDER — DEXTROSE 5 % IV SOLN
INTRAVENOUS | Status: AC
  Administered 2017-07-06 – 2017-07-07 (×3): via INTRAVENOUS

## 2017-07-06 MED ORDER — FENTANYL CITRATE (PF) 100 MCG/2ML IJ SOLN
INTRAMUSCULAR | Status: DC
Start: 2017-07-06 — End: 2017-07-06
  Filled 2017-07-06: qty 2

## 2017-07-06 MED ORDER — FENTANYL 25 MCG/HR TD PT72
25.0000 ug | MEDICATED_PATCH | TRANSDERMAL | Status: DC
  Administered 2017-07-06: 18:00:00 25 ug via TRANSDERMAL
  Filled 2017-07-06: qty 1

## 2017-07-06 MED ORDER — ENOXAPARIN SODIUM 40 MG/0.4ML ~~LOC~~ SOLN
40.0000 mg | SUBCUTANEOUS | Status: DC
  Administered 2017-07-06: 22:00:00 40 mg via SUBCUTANEOUS
  Filled 2017-07-06: qty 0.4

## 2017-07-06 MED ORDER — MORPHINE SULFATE (PF) 2 MG/ML IV SOLN
2.0000 mg | INTRAVENOUS | Status: DC | PRN
  Administered 2017-07-06 – 2017-07-07 (×8): 2 mg via INTRAVENOUS
  Filled 2017-07-06 (×8): qty 1

## 2017-07-06 MED ORDER — MIDAZOLAM HCL 5 MG/5ML IJ SOLN
INTRAMUSCULAR | Status: AC
  Filled 2017-07-06: qty 5

## 2017-07-06 NOTE — Progress Notes (Addendum)
[place] [facility] [place] NAME: Joseph Hunt    MR#:  [id number]  DATE OF BIRTH:  11/16/1945  SUBJECTIVE:  CHIEF COMPLAINT:   Chief Complaint  Patient presents with  . 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 Brought lethargic with hypercalcemia. He is little more awake today. His wife is in the room.  REVIEW OF SYSTEMS:   Can not give ROS due to Hypercalcemia and confusion.  ROS  DRUG ALLERGIES:  No Known Allergies  VITALS:  Blood pressure (!) 152/105, pulse (!) 110, temperature (!) 97.5 F (36.4 C), temperature source Axillary, resp. rate (!) 26, height 6\' 1"  (1.854 m), weight 72.6 kg (160 lb), SpO2 98 %.  PHYSICAL EXAMINATION:   GENERAL:  72 y.o.-year-old malnourished patient lying in the bed with no acute distress.  EYES: Pupils equal, round, reactive to light and accommodation. No scleral icterus. Extraocular muscles intact.  HEENT: Head atraumatic, normocephalic. Oropharynx and nasopharynx clear.  NECK:  Supple, no jugular venous distention. No thyroid enlargement, no tenderness.  LUNGS: Normal breath sounds bilaterally, no wheezing, rales,rhonchi or crepitation. No use of accessory muscles of respiration.  CARDIOVASCULAR: S1, S2 normal. No murmurs, rubs, or gallops.  ABDOMEN: Soft, nontender, nondistended. Bowel sounds present. No organomegaly or mass.  EXTREMITIES: No pedal edema, cyanosis, or clubbing.  NEUROLOGIC: patient is alert, confused and moves all limbs spontaneously. PSYCHIATRIC: The patient is alert and confused.  SKIN: No obvious rash, lesion, or ulcer.   Physical Exam LABORATORY PANEL:   CBC Recent Labs  Lab 07/06/17 0719  WBC 11.8*  HGB 12.6*  HCT 39.1*  PLT 73*   ------------------------------------------------------------------------------------------------------------------  Chemistries  Recent Labs  Lab 07/11/2017 1652  07/06/17 0719  NA 148*   < > 156*   K 3.9   < > 3.9  CL 106   < > 121*  CO2 31   < > 27  GLUCOSE 116*   < > 113*  BUN 60*   < > 96*  CREATININE 1.63*   < > 2.15*  CALCIUM 15.0*   < > 10.7*  AST 223*  --   --   ALT 249*  --   --   ALKPHOS 563*  --   --   BILITOT 4.0*  --   --    < > = values in this interval not displayed.   ------------------------------------------------------------------------------------------------------------------  Cardiac Enzymes Recent Labs  Lab 07/04/17 0319 07/04/17 0800  TROPONINI 0.09* 0.09*   ------------------------------------------------------------------------------------------------------------------  RADIOLOGY:  No results found.  ASSESSMENT AND PLAN:   Active Problems:   Acute encephalopathy   Protein-calorie malnutrition, severe  Patient is a 72 year old African-American male presenting with altered mental status noted to have severe hypercalcemia  1.  Acute encephalopathy suspect this is due to severe hypercalcemia and dehydration.   aggressive IV fluids follow calcium levels   Given Zometa by Oncology.   Slow and some improvement.  2.  Hypercalcemia related to metastatic malignancy to bone  Patient has received a dose of Zometa,  IV fluids we will recheck ionized calcium - came down.  3.  Likely metastatic lung cancer oncology recommends biopsy of the liver lesions -    IR planning to do liver biopsy today.  oncology recommends MRI of the brain. Which can wait until pt is able to follow commands.  4.  Oral thrush Unable to do nystatin swish and swallow Given dose of IV  fluconazole  5.  Leukocytosis no evidence of infection will monitor for fevers hold off on any antibiotics now  6.  Miscellaneous Lovenox for DVT prophylaxis  7. Pain- can not take oral- give morphin IV  8. Ac renal failure- Due to dehydration   Con IV fluids, monitor, have decreased output.  9. Hypernatremia- keep on D5w, monitor.  All the records are reviewed and case  discussed with Care Management/Social Workerr. Management plans discussed with the patient, family and they are in agreement.  CODE STATUS: DNR.  TOTAL TIME TAKING CARE OF THIS PATIENT: 32 minutes.   Discussed with patient's wife in the room.  POSSIBLE D/C IN 1-2 DAYS, DEPENDING ON CLINICAL CONDITION.   Vaughan Basta M.D on 07/06/2017   Between 7am to 6pm - Pager - [phone]  After 6pm go to [email or web address] - password [email or web address] [facility]  Office  [phone]  CC: Primary care physician; Biagio Borg, MD  Note: This dictation was prepared with Dragon dictation along with smaller phrase technology. Any transcriptional errors that result from this process are unintentional.

## 2017-07-06 NOTE — Care Management Important Message (Signed)
Important Message  Patient Details  Name: Joseph Hunt MRN: [id number] Date of Birth: Feb 29, 1945   Medicare Important Message Given:  Yes    Juliann Pulse A Denym Christenberry 07/06/2017, 10:41 AM

## 2017-07-06 NOTE — Progress Notes (Signed)
Patient's calcium level is slowly improving and by report he is more awake today.  Case discussed with interventional radiology and they will attempt to biopsy a supraclavicular lymph node rather than liver.  Continue current treatments.  Please draw a complete metabolic panel over the weekend to reassess patient's liver function as this was increased on admission.  Once biopsy results have returned, will further discuss possible treatments versus hospice with the patient and his family. Appreciate palliative care input. I will be away until Tuesday, Jul 10, 2017.  Please call the on-call physician, Dr. Mike Gip, if there are any questions or concerns.

## 2017-07-06 NOTE — Procedures (Signed)
US guided core biopsies of enlarged right supraclavicular lymph node.  Specimens placed in formalin.  Minimal blood loss and no immediate complication.

## 2017-07-06 NOTE — Progress Notes (Signed)
Daily Progress Note   Patient Name: Joseph Hunt       Date: 07/06/2017 DOB: 02/04/1946  Age: 72 y.o. MRN#: [id number] Attending Physician: Vaughan Basta, * Primary Care Physician: Biagio Borg, MD Admit Date: 06/17/2017  Reason for Consultation/Follow-up: Establishing goals of care  Subjective: Patient lethargic in bed. He is a little more awake and will open eyes when name called. Calcium continues to show some improvement however, kidney functions continue to elevate. Patient appears comfortable and in no apparent distress. Wife and son is at the bedside along with other family members. Family is hopeful biopsy will be performed today and they will have some answers from Dr. Grayland Ormond on next week.  Discussed the plan is to continue to treat the treatable throughout the weekend.  He states they are not prepared to make any decisions as far as his care and to have a further conversations with oncology and receiving biopsy results.  At that time they would be better prepared to discuss full comfort measures and hospice versus his eligibility to receive chemotherapy.  Family appreciative of conversations and the care they have been receiving.  They are aware that plans are to proceed with biopsy on today based on radiology scheduling.   Chart reviewed and report received from bedside RN.   Length of Stay: 3  Current Medications: Scheduled Meds:  . enoxaparin (LOVENOX) injection  40 mg Subcutaneous Q24H  . fentaNYL  25 mcg Transdermal Q72H  . [START ON 22-Jul-2017] metoprolol tartrate  2.5 mg Intravenous Q6H    Continuous Infusions: . dextrose 100 mL/hr at 07/06/17 1833  . fluconazole (DIFLUCAN) IV Stopped (07/05/17 2330)    PRN Meds: acetaminophen **OR** acetaminophen,  hydrALAZINE, ketorolac, morphine injection, ondansetron **OR** ondansetron (ZOFRAN) IV  Physical Exam         Constitutional: He appears lethargic. Somewhat more alert today. He appears cachectic. He appears ill.  Fragile appearance   Eyes:  Sunken   Cardiovascular: Normal rate, regular rhythm, normal heart sounds, intact distal pulses and normal pulses.  Pulmonary/Chest: Effort normal. He has decreased breath sounds.  Abdominal: Normal appearance and bowel sounds are normal.  Musculoskeletal:  Weakness   Neurological: He appears lethargic. He displays atrophy.  Psychiatric: Cognition and memory are normal. He expresses inappropriate judgment.  Nursing note and vitals reviewed  Vital Signs: BP 133/85 (BP Location: Left Arm)   Pulse (!) 105   Temp 98.9 F (37.2 C) (Oral)   Resp 18   Ht 6\' 1"  (1.854 m)   Wt 72.6 kg (160 lb)   SpO2 97%   BMI 21.11 kg/m  SpO2: SpO2: 97 % O2 Device: O2 Device: Nasal Cannula O2 Flow Rate: O2 Flow Rate (L/min): 2 L/min  Intake/output summary:   Intake/Output Summary (Last 24 hours) at 07/06/2017 1924 Last data filed at 07/06/2017 1903 Gross per 24 hour  Intake 1715 ml  Output 950 ml  Net 765 ml   LBM: Last BM Date: 07/04/17 Baseline Weight: Weight: 72.6 kg (160 lb) Most recent weight: Weight: 72.6 kg (160 lb)       Palliative Assessment/Data:PPS 10%.    Patient Active Problem List   Diagnosis Date Noted  . Protein-calorie malnutrition, severe 07/04/2017  . Mass of upper lobe of right lung 07/06/2017  . Hypercalcemia 06/28/2017  . Acute encephalopathy 06/15/2017  . Metastatic malignant neoplasm ([place]) 06/22/2017  . Encounter for well adult exam with abnormal findings 12/21/2016  . Need for hepatitis C screening test 12/21/2016  . BPH (benign prostatic hyperplasia) 12/21/2016  . HLD (hyperlipidemia) 12/21/2016  . Right hip pain 12/21/2016  . Arthritis   . Hypertension   . Smoker     Palliative Care Assessment & Plan   Patient  Profile: 72 y.o. male  admitted on 06/23/2017 via wheelchair from the cancer center with abnormal labs, pain, and altered mental status. He has a past medical history of arthritis, hypertension, smoker, and BPH. Patient presented to [facility] emergency room approximately 10 to 11 days ago with worsening back pain radiating down his legs. Patient was found to have widely metastatic disease in his bones and liver with a primary lung mass. A referral was made to the [facility] center, but patient'sappointment wasnot scheduled untilnext week. Since his emergency room visit, he has rapidly declinedto the point where he is confused and cannot offer review of systems. His family reports that2 weeks ago he was playing football in the yard with his family. He now has minimal POintake, constant pain, and nearly bedridden. Patient son also reports a 40-50 pound weight loss over the past several months. Patient is clearly uncomfortable and confused. Palliative Medicine team consulted for goals of care discussion.   Recommendations/Plan:  DNR/DNI at family's request  Continue to treat the treatable. Family is hopeful patient will improve to baseline, and can proceed with liver biopsy and possibly chemotherapy. He is currently schedule to have biopsy today per radiology. Calcium levels have improved. They are aware that improvement is needed in regards to both functional and nutritional status in order to tolerate aggressive treatments (chemo).   Would like to hear prognosis and treatment plan from Dr. Grayland Ormond prior to making any decisions on palliative or hospice.   Agree with Toradol and morphine IV PRN for pain. May consider 19mcg fentanyl patch for more pain control.   Agree with Zofran PRN for nausea.   Palliative Medicine team will continue to support patient, family, and medical team throughout hospitalization  Goals of Care and Additional Recommendations:  Limitations on Scope  of Treatment: Full Scope Treatment  Code Status:    Code Status Orders  (From admission, onward)        Start     Ordered   07/04/17 1034  Do not attempt resuscitation (DNR)  Continuous    Question Answer Comment  In  the event of cardiac or respiratory ARREST Do not call a "code blue"   In the event of cardiac or respiratory ARREST Do not perform Intubation, CPR, defibrillation or ACLS   In the event of cardiac or respiratory ARREST Use medication by any route, position, wound care, and other measures to relive pain and suffering. May use oxygen, suction and manual treatment of airway obstruction as needed for comfort.      07/04/17 1033    Code Status History    Date Active Date Inactive Code Status Order ID Comments User Context   07/08/2017 2015 07/04/2017 1033 Full Code [id number]  Dustin Flock, MD Inpatient       Prognosis:   Unable to determine Unable to determine-poor in the setting of hypertension, metastatic right upper lobe lung cancer with widespread mets to liver and bone, hypercalcemia, altered mental status, decreased mobility, poor po intake, and generalized pain  Discharge Planning:  To Be Determined  Care plan was discussed with patient's family and bedside RN.   Thank you for allowing the Palliative Medicine Team to assist in the care of this patient.   Total Time 35 min.  Prolonged Time Billed  NO        Greater than 50%  of this time was spent counseling and coordinating care related to the above assessment and plan.  Alda Lea, NP-BC Palliative Medicine Team  Phone: [phone] Fax: [phone]  Please contact Palliative Medicine Team phone at [phone] for questions and concerns.

## 2017-07-06 NOTE — Progress Notes (Signed)
Patient ID: Joseph Hunt, male   DOB: [birth year], 72 y.o.   MRN: [id number] Request for a tissue biopsy.  72 yo male with evidence of metastatic disease, presumably from a right lung primary.  Reviewed patient's H & P and recent imaging.  There are innumerable liver lesions but there is also a prominent right supraclavicular lymph node.  Patient is confused and INR is borderline at 1.5.  Plan for US guided core biopsy of right supraclavicular lymph node.  If the lymph node biopsy is not feasible then will evaluate liver for tissue sampling.  Informed consent obtained from patient's wife.

## 2017-07-07 DIAGNOSIS — N19 Unspecified kidney failure: Secondary | ICD-10-CM

## 2017-07-07 DIAGNOSIS — C3411 Malignant neoplasm of upper lobe, right bronchus or lung: Principal | ICD-10-CM

## 2017-07-07 DIAGNOSIS — M899 Disorder of bone, unspecified: Secondary | ICD-10-CM

## 2017-07-07 DIAGNOSIS — E279 Disorder of adrenal gland, unspecified: Secondary | ICD-10-CM

## 2017-07-07 DIAGNOSIS — R7989 Other specified abnormal findings of blood chemistry: Secondary | ICD-10-CM

## 2017-07-07 LAB — BASIC METABOLIC PANEL
Anion gap: 12 (ref 5–15)
BUN: 120 mg/dL — ABNORMAL HIGH (ref 6–20)
CALCIUM: 9.8 mg/dL (ref 8.9–10.3)
CO2: 27 mmol/L (ref 22–32)
Chloride: 119 mmol/L — ABNORMAL HIGH (ref 101–111)
Creatinine, Ser: 2.83 mg/dL — ABNORMAL HIGH (ref 0.61–1.24)
GFR calc Af Amer: 24 mL/min — ABNORMAL LOW (ref >60)
GFR, EST NON AFRICAN AMERICAN: 21 mL/min — AB (ref >60)
Glucose, Bld: 135 mg/dL — ABNORMAL HIGH (ref 65–99)
POTASSIUM: 4.4 mmol/L (ref 3.5–5.1)
SODIUM: 158 mmol/L — AB (ref 135–145)

## 2017-07-07 LAB — HEPATIC FUNCTION PANEL
ALT: 501 U/L — AB (ref 17–63)
AST: 708 U/L — ABNORMAL HIGH (ref 15–41)
Albumin: 2.3 g/dL — ABNORMAL LOW (ref 3.5–5.0)
Alkaline Phosphatase: 408 U/L — ABNORMAL HIGH (ref 38–126)
BILIRUBIN INDIRECT: 1.7 mg/dL — AB (ref 0.3–0.9)
Bilirubin, Direct: 2.8 mg/dL — ABNORMAL HIGH (ref 0.1–0.5)
TOTAL PROTEIN: 5.7 g/dL — AB (ref 6.5–8.1)
Total Bilirubin: 4.5 mg/dL — ABNORMAL HIGH (ref 0.3–1.2)

## 2017-07-07 MED ORDER — METHYLPREDNISOLONE SODIUM SUCC 125 MG IJ SOLR
60.0000 mg | Freq: Once | INTRAMUSCULAR | Status: AC
  Administered 2017-07-07: 60 mg via INTRAVENOUS
  Filled 2017-07-07: qty 2

## 2017-07-07 MED ORDER — GLYCOPYRROLATE 0.2 MG/ML IJ SOLN
0.1000 mg | INTRAMUSCULAR | Status: DC | PRN
  Administered 2017-07-07: 16:00:00 0.1 mg via INTRAVENOUS
  Filled 2017-07-07 (×3): qty 0.5

## 2017-07-07 MED ORDER — GLYCOPYRROLATE 0.2 MG/ML IJ SOLN
0.1000 mg | Freq: Two times a day (BID) | INTRAMUSCULAR | Status: DC
  Administered 2017-07-07: 11:00:00 0.1 mg via INTRAVENOUS
  Filled 2017-07-07 (×2): qty 0.5

## 2017-07-07 MED ORDER — DEXTROSE 5 % IV SOLN
INTRAVENOUS | Status: DC
  Administered 2017-07-07: 09:00:00 via INTRAVENOUS

## 2017-07-07 MED ORDER — DEXTROSE 5 % IV SOLN: INTRAVENOUS | Status: DC

## 2017-07-07 MED ORDER — MORPHINE SULFATE (PF) 2 MG/ML IV SOLN
2.0000 mg | INTRAVENOUS | Status: DC | PRN
  Administered 2017-07-07 (×4): 2 mg via INTRAVENOUS
  Filled 2017-07-07 (×4): qty 1

## 2017-07-07 MED ORDER — DIPHENHYDRAMINE HCL 50 MG/ML IJ SOLN
50.0000 mg | Freq: Once | INTRAMUSCULAR | Status: AC
  Administered 2017-07-07: 50 mg via INTRAVENOUS
  Filled 2017-07-07: qty 1

## 2017-07-10 ENCOUNTER — Ambulatory Visit (HOSPITAL_COMMUNITY): Payer: Medicare Other

## 2017-07-10 ENCOUNTER — Encounter (HOSPITAL_COMMUNITY): Payer: Self-pay

## 2017-07-10 LAB — SURGICAL PATHOLOGY

## 2017-07-14 NOTE — Progress Notes (Signed)
Took over pt care at 1630, pt comfort care, PRN robinol and morphine, family at bedside

## 2017-07-14 NOTE — Progress Notes (Signed)
Family made staff aware that pt passed, x2 RN verified, pt without pulse and respirations, family at bedside

## 2017-07-14 NOTE — Progress Notes (Signed)
[facility] Hematology/Oncology Progress Note  Date of admission: 06/25/2017  Hospital day:  07/08/17  Chief Complaint: Joseph Hunt is a 72 y.o. male who was admitted altered mental status and imaging c/w widepread metastatic lung cancer.  Subjective: Patient somnolent.  Social History: The patient is accompanied by multiple family members including his wife, brother, niece, and nephew today.  Allergies: No Known Allergies  Scheduled Medications: . fentaNYL  25 mcg Transdermal Q72H  . glycopyrrolate  0.1 mg Intravenous BID    Review of Systems: Unable to be obtained as patient not responsive.   PERFORMANCE STATUS (ECOG):  4  Physical Exam: Blood pressure 134/78, pulse (!) 118, temperature 98.3 F (36.8 C), temperature source Axillary, resp. rate 20, height '6\' 1"'$  (1.854 m), weight 160 lb (72.6 kg), SpO2 90 %.  GENERAL:  Elderly gentleman lying on the medical unit with agonal breathing. MENTAL STATUS:  Unrepsonsive. HEAD:  Pearline Cables hair.  Right sided facial droop.  Normocephalic, atraumatic, and no Cushingoid features. EYES:  Brown eyes.  Pupils small and minimally reactive.  Scleral icterus. ENT:  Oropharynx clear without lesion.  Mucous membranes moist.  RESPIRATORY:  Coarse breath sounds bilaterally.  No wheezes.  CARDIOVASCULAR:  Regular rate and rhythm without murmur, rub or gallop. ABDOMEN:  Soft, non-tender, with active bowel sounds, and no appreciable hepatosplenomegaly.  No masses. SKIN:  No rashes, ulcers or lesions. EXTREMITIES: No edema, no skin discoloration or tenderness.  No palpable cords. LYMPH NODES: No palpable cervical, supraclavicular, or axillary adenopathy  NEUROLOGICAL: Unresponsive.  Moans at times.   Results for orders placed or performed during the hospital encounter of 06/23/2017 (from the past 48 hour(s))  Basic metabolic panel     Status: Abnormal   Collection Time: 07/06/17  7:19 AM  Result Value Ref Range   Sodium 156 (H)  135 - 145 mmol/L   Potassium 3.9 3.5 - 5.1 mmol/L   Chloride 121 (H) 101 - 111 mmol/L   CO2 27 22 - 32 mmol/L   Glucose, Bld 113 (H) 65 - 99 mg/dL   BUN 96 (H) 6 - 20 mg/dL    Comment: RESULT CONFIRMED BY MANUAL DILUTION JJB   Creatinine, Ser 2.15 (H) 0.61 - 1.24 mg/dL   Calcium 10.7 (H) 8.9 - 10.3 mg/dL    Comment: RESULTS VERIFIED BY REPEAT TESTING JJB   GFR calc non Af Amer 29 (L) >60 mL/min   GFR calc Af Amer 34 (L) >60 mL/min    Comment: (NOTE) The eGFR has been calculated using the CKD EPI equation. This calculation has not been validated in all clinical situations. eGFR's persistently <60 mL/min signify possible Chronic Kidney Disease.    Anion gap 8 5 - 15    Comment: Performed at [facility], [address] [place], [place] [address]  CBC     Status: Abnormal   Collection Time: 07/06/17  7:19 AM  Result Value Ref Range   WBC 11.8 (H) 3.8 - 10.6 K/uL   RBC 4.75 4.40 - 5.90 MIL/uL   Hemoglobin 12.6 (L) 13.0 - 18.0 g/dL   HCT 39.1 (L) 40.0 - 52.0 %   MCV 82.3 80.0 - 100.0 fL   MCH 26.5 26.0 - 34.0 pg   MCHC 32.2 32.0 - 36.0 g/dL   RDW 14.2 11.5 - 14.5 %   Platelets 73 (L) 150 - 440 K/uL    Comment: Performed at [facility], [address] [place], [place] [address]  Protime-INR  Status: Abnormal   Collection Time: 07/06/17  7:19 AM  Result Value Ref Range   Prothrombin Time 18.0 (H) 11.4 - 15.2 seconds   INR 1.50     Comment: Performed at [facility], [address] [place], [place] [address]  APTT     Status: None   Collection Time: 07/06/17  7:19 AM  Result Value Ref Range   aPTT 25 24 - 36 seconds    Comment: Performed at [facility], [address] [place], [place] [address]  Basic metabolic panel     Status: Abnormal   Collection Time: July 27, 2017  4:22 AM  Result Value Ref Range   Sodium 158 (H) 135 - 145 mmol/L   Potassium 4.4 3.5 - 5.1 mmol/L   Chloride 119 (H) 101 - 111 mmol/L   CO2 27 22 - 32 mmol/L    Glucose, Bld 135 (H) 65 - 99 mg/dL   BUN 120 (H) 6 - 20 mg/dL    Comment: RESULTS CONFIRMED BY MANUAL DILUTION   Creatinine, Ser 2.83 (H) 0.61 - 1.24 mg/dL   Calcium 9.8 8.9 - 10.3 mg/dL   GFR calc non Af Amer 21 (L) >60 mL/min   GFR calc Af Amer 24 (L) >60 mL/min    Comment: (NOTE) The eGFR has been calculated using the CKD EPI equation. This calculation has not been validated in all clinical situations. eGFR's persistently <60 mL/min signify possible Chronic Kidney Disease.    Anion gap 12 5 - 15    Comment: Performed at [facility], [address] [place], [place] [address]  Hepatic function panel     Status: Abnormal   Collection Time: Jul 27, 2017  4:22 AM  Result Value Ref Range   Total Protein 5.7 (L) 6.5 - 8.1 g/dL   Albumin 2.3 (L) 3.5 - 5.0 g/dL   AST 708 (H) 15 - 41 U/L   ALT 501 (H) 17 - 63 U/L   Alkaline Phosphatase 408 (H) 38 - 126 U/L   Total Bilirubin 4.5 (H) 0.3 - 1.2 mg/dL   Bilirubin, Direct 2.8 (H) 0.1 - 0.5 mg/dL   Indirect Bilirubin 1.7 (H) 0.3 - 0.9 mg/dL    Comment: Performed at [facility], [address], [place], [place] [address]   [place] Core Biopsy (lymph Nodes)  Result Date: 07/06/2017 INDICATION: 72 year old with evidence for metastatic cancer and needs a tissue diagnosis. Patient has enlarged right supraclavicular lymph node on recent CT. EXAM: ULTRASOUND-GUIDED CORE BIOPSY OF RIGHT SUPRACLAVICULAR LYMPH NODE MEDICATIONS: None. ANESTHESIA/SEDATION: None FLUOROSCOPY TIME:  None COMPLICATIONS: None immediate. PROCEDURE: Patient is confused and informed consent was obtained from the patient's wife. The right side of the neck was evaluated with ultrasound. Enlarged right supraclavicular lymph nodes were identified. Right side of the neck was prepped with chlorhexidine and sterile field was created. Skin and soft tissues were anesthetized with 1% lidocaine. Using ultrasound guidance, 18 gauge core needle was directed into the enlarged  supraclavicular lymph node. A total of 4 core biopsies were obtained. Specimens placed in formalin. Bandage placed over the puncture site. FINDINGS: Enlarged right supraclavicular lymph nodes adjacent to the right internal jugular vein. Largest lymph node measures up to 3.5 cm. No significant bleeding or hematoma formation following the core biopsies. IMPRESSION: Ultrasound-guided core biopsy of an enlarged right supraclavicular lymph node. Electronically Signed   By: Markus Daft M.D.   On: 07/06/2017 16:21    Assessment:  Minoru Chap is a 72 y.o. male with  probable widespread metastatic carcinoma of lung origin.  He has a 3.9 cm RUL mass, mediastinal and supraclavicular adenopathy, innumerable liver lesions (7 mm - 4 .1 cm), 1.9 cm left adrenal nodule, and innumerable lytic bone lesions.  He is s/p right supraclavicular biopsy on 07/06/2017.  He presented with altered mental status.  Head CT without contrast revealed no acute abnormality.  He had hypercalcemia treated with IVF and Zometa (Calcium 15 to 9.8).  He has multi-organ failure.  Liver function tests today reveal am AST 708, ALT 501, alk phosphatase 408, and bilirubin 4.5.  Renal function has deteriorated with a BUN 120 and a creatinine of 2.83.   Symptomatically, he is unresponsive with agonal breathing.  Performance status is 4.  Plan: 1. Oncology:  Spoke at length with patient's wife and family regarding widespread metastatic disease and organ dysfunction.  She had spoken previously with Dr Anselm Jungling.  She wishes to "go for the comfort zone".  She does not want any further interventions.  His life expectancy is brief.  Code status is DNR.  Comfort provided the family.   Lequita Asal, MD  Aug 03, 2017, 2:07 PM

## 2017-07-14 NOTE — Discharge Summary (Signed)
Date of death- Jul 09, 2017  Cause of death    Lung cancer with mets    Multiorgan failure    Hypercalcemia  Hospital course  Patient is a 72 year old African-American male presenting with altered mental status noted to have severe hypercalcemia  *Acute encephalopathy suspect this is due to severe hypercalcemia and dehydration.   aggressive IV fluids follow calcium levels   Given Zometa by Oncology.   Much worse now.  *Hypercalcemia related to metastatic malignancy to bone  Patient has received a dose of Zometa, IV fluids  - came down.  *Likely metastatic lung cancer oncology recommends biopsy of the liver lesions -    IR had done Supraclavicular biopsy.  *Oral thrush Unable to do nystatin swish and swallow Given dose of IV fluconazole  * multiorgan failure   Have renal failure and now elevated LFTs   Pt have very poor prognosis and may not survive this hospital stay.   Discussed with is wife , brother and nephew in room- they had agreed on Comfort care.  *Leukocytosis no evidence of infection will monitor for fevers hold off on any antibiotics now  * Miscellaneous Lovenox for DVT prophylaxis  * Pain- can not take oral- give morphin IV  * Ac renal failure- Due to dehydration   Con IV fluids, monitor, have decreased output.  * Hypernatremia- keep on D5w, monitor.  Pt died in hospital in comfort care

## 2017-07-14 NOTE — Progress Notes (Signed)
Family Meeting Note  Advance Directive:yes  Today a meeting took place with the spouse and brother.  Patient is unable to participate due HN:GITJLL capacity Comatose   The following clinical team members were present during this meeting:MD  The following were discussed:Patient's diagnosis: Metastatic lung cancer, multiorgan failure , Patient's progosis: < 2 weeks and Goals for treatment: DNR  Additional follow-up to be provided: Comfort care.  Time spent during discussion:20 minutes  Vaughan Basta, MD

## 2017-07-14 NOTE — Progress Notes (Signed)
[facility] [place] NAME: Joseph Hunt    MR#:  [id number]  DATE OF BIRTH:  24-Oct-1945  SUBJECTIVE:  CHIEF COMPLAINT:   Chief Complaint  Patient presents with  . 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 Brought lethargic with hypercalcemia. Appears worse today.  REVIEW OF SYSTEMS:   Can not give ROS due to Hypercalcemia and confusion.  ROS  DRUG ALLERGIES:  No Known Allergies  VITALS:  Blood pressure 134/78, pulse (!) 118, temperature 98.3 F (36.8 C), temperature source Axillary, resp. rate 20, height 6\' 1"  (1.854 m), weight 72.6 kg (160 lb), SpO2 90 %.  PHYSICAL EXAMINATION:   GENERAL:  72 y.o.-year-old malnourished patient lying in the bed with terminal appearance.  EYES: Pupils equal, round, reactive to light and accommodation. No scleral icterus. HEENT: Head atraumatic, normocephalic. Oropharynx and nasopharynx clear.  NECK:  Supple, no jugular venous distention. No thyroid enlargement, no tenderness.  LUNGS: shallow breath sounds bilaterally, no wheezing, b/l crepitation. No use of accessory muscles of respiration. On nasal canula oxygen. CARDIOVASCULAR: S1, S2 normal. No murmurs, rubs, or gallops.  ABDOMEN: Soft, nontender, nondistended. Bowel sounds present. No organomegaly or mass.  EXTREMITIES: No pedal edema, cyanosis, or clubbing.  NEUROLOGIC: patient is lethargic, not responding. PSYCHIATRIC: The patient is lethargic.  SKIN: No obvious rash, lesion, or ulcer.   Physical Exam LABORATORY PANEL:   CBC Recent Labs  Lab 07/06/17 0719  WBC 11.8*  HGB 12.6*  HCT 39.1*  PLT 73*   ------------------------------------------------------------------------------------------------------------------  Chemistries  Recent Labs  Lab 07/20/2017 0422  NA 158*  K 4.4  CL 119*  CO2 27  GLUCOSE 135*  BUN 120*  CREATININE 2.83*  CALCIUM 9.8  AST 708*  ALT 501*  ALKPHOS 408*   BILITOT 4.5*   ------------------------------------------------------------------------------------------------------------------  Cardiac Enzymes Recent Labs  Lab 07/04/17 0319 07/04/17 0800  TROPONINI 0.09* 0.09*   ------------------------------------------------------------------------------------------------------------------  RADIOLOGY:  [place] Core Biopsy (lymph Nodes)  Result Date: 07/06/2017 INDICATION: 72 year old with evidence for metastatic cancer and needs a tissue diagnosis. Patient has enlarged right supraclavicular lymph node on recent CT. EXAM: ULTRASOUND-GUIDED CORE BIOPSY OF RIGHT SUPRACLAVICULAR LYMPH NODE MEDICATIONS: None. ANESTHESIA/SEDATION: None FLUOROSCOPY TIME:  None COMPLICATIONS: None immediate. PROCEDURE: Patient is confused and informed consent was obtained from the patient's wife. The right side of the neck was evaluated with ultrasound. Enlarged right supraclavicular lymph nodes were identified. Right side of the neck was prepped with chlorhexidine and sterile field was created. Skin and soft tissues were anesthetized with 1% lidocaine. Using ultrasound guidance, 18 gauge core needle was directed into the enlarged supraclavicular lymph node. A total of 4 core biopsies were obtained. Specimens placed in formalin. Bandage placed over the puncture site. FINDINGS: Enlarged right supraclavicular lymph nodes adjacent to the right internal jugular vein. Largest lymph node measures up to 3.5 cm. No significant bleeding or hematoma formation following the core biopsies. IMPRESSION: Ultrasound-guided core biopsy of an enlarged right supraclavicular lymph node. Electronically Signed   By: Markus Daft M.D.   On: 07/06/2017 16:21    ASSESSMENT AND PLAN:   Active Problems:   Acute encephalopathy   Protein-calorie malnutrition, severe  Patient is a 72 year old African-American male presenting with altered mental status noted to have severe hypercalcemia  *  Acute  encephalopathy suspect this is due to severe hypercalcemia and dehydration.   aggressive IV fluids follow calcium levels   Given Zometa by  Oncology.   Much worse now.  *  Hypercalcemia related to metastatic malignancy to bone  Patient has received a dose of Zometa,  IV fluids  - came down.  *  Likely metastatic lung cancer oncology recommends biopsy of the liver lesions -    IR had done Supraclavicular biopsy.  *  Oral thrush Unable to do nystatin swish and swallow Given dose of IV fluconazole  * multiorgan failure   Have renal failure and now elevated LFTs   Pt have very poor prognosis and may not survive this hospital stay.   Discussed with is wife , brother and nephew in room- they had agreed on Comfort care.  *  Leukocytosis no evidence of infection will monitor for fevers hold off on any antibiotics now  *   Miscellaneous Lovenox for DVT prophylaxis  * Pain- can not take oral- give morphin IV  * Ac renal failure- Due to dehydration   Con IV fluids, monitor, have decreased output.  * Hypernatremia- keep on D5w, monitor.  All the records are reviewed and case discussed with Care Management/Social Workerr. Management plans discussed with the patient, family and they are in agreement.  CODE STATUS: DNR.  TOTAL TIME TAKING CARE OF THIS PATIENT: 35 minutes.   Discussed with patient's wife in the room.  POSSIBLE D/C IN 1-2 DAYS, DEPENDING ON CLINICAL CONDITION.   Vaughan Basta M.D on 07/17/2017   Between 7am to 6pm - Pager - [phone]  After 6pm go to [email or web address] - password [email or web address] [facility]  Office  [phone]  CC: Primary care physician; Biagio Borg, MD  Note: This dictation was prepared with Dragon dictation along with smaller phrase technology. Any transcriptional errors that result from this process are unintentional.

## 2017-07-14 NOTE — Progress Notes (Signed)
Spoke with hospitalist about pt possibly reacting to something from the liver biopsy or the fentanyl patch. Per MD, remove patch and given benadryl and solumedrol. Will pass on information to oncoming nurse in am. May possible place fentanyl on allergy list. Pt has wheal on face, arm, neck, chest and possible back. Will administer medication and continue to monitor.

## 2017-07-14 DEATH — deceased

## 2017-07-19 ENCOUNTER — Ambulatory Visit (HOSPITAL_COMMUNITY): Payer: Medicare Other | Admitting: Hematology

## 2017-12-25 ENCOUNTER — Encounter: Payer: Medicare Other | Admitting: Internal Medicine

## 2018-09-19 IMAGING — CT CT CHEST W/ CM
2 of 3 series · 15 of 36 positions shown, 18 images · IV contrast (iopamidol)
Comparison: 06/21/2017 CT abdomen/pelvis. 06/21/2017 chest
radiograph.

CLINICAL DATA: New diagnosis of widespread metastatic disease to
the liver and bones on recent CT abdomen/pelvis study performed for
weight loss and abdominal pain.

EXAM:
CT CHEST WITH CONTRAST
TECHNIQUE: Multidetector CT imaging of the chest was performed during
intravenous contrast administration.
CONTRAST:  80mL OG2XJ5-LMM IOPAMIDOL (OG2XJ5-LMM) INJECTION 61%

[Series 2: thorax · axial · 0.74mm/px · z∈[-337,-59]mm · 12 of 165 slices shown, 15 images]
[im 13/165  mediastinal]
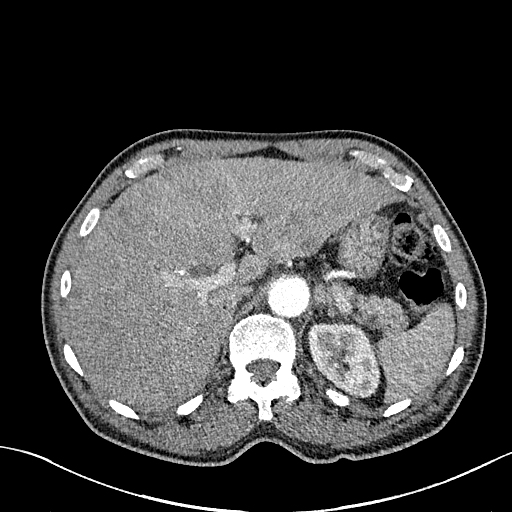
[im 13/165  lung]
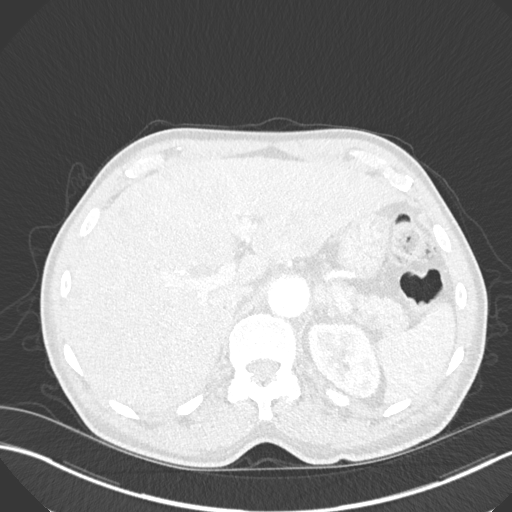
[im 25/165  lung]
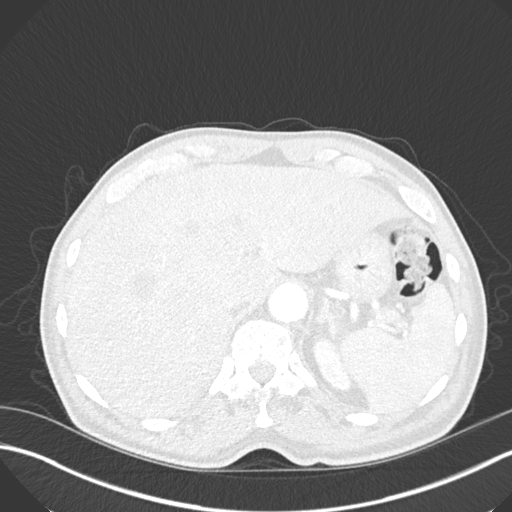
[im 37/165  lung]
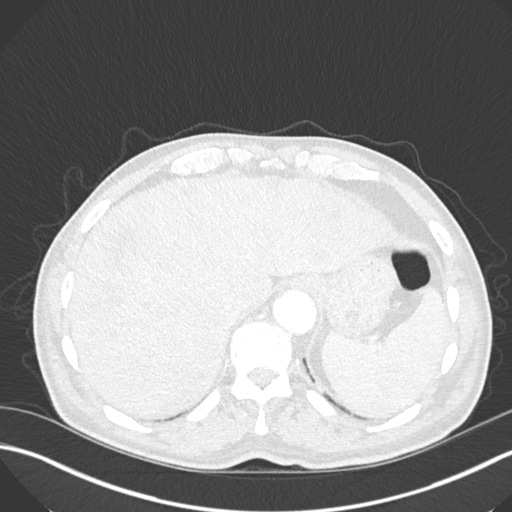
[im 49/165  lung]
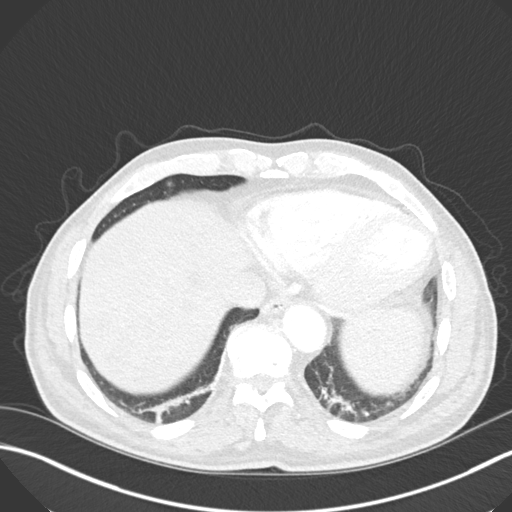
[im 61/165  mediastinal]
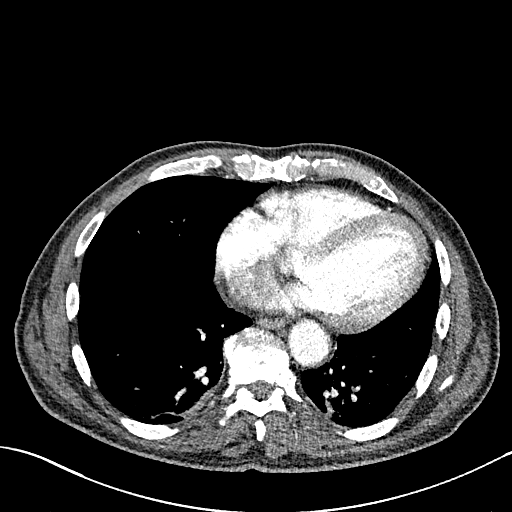
[im 61/165  lung]
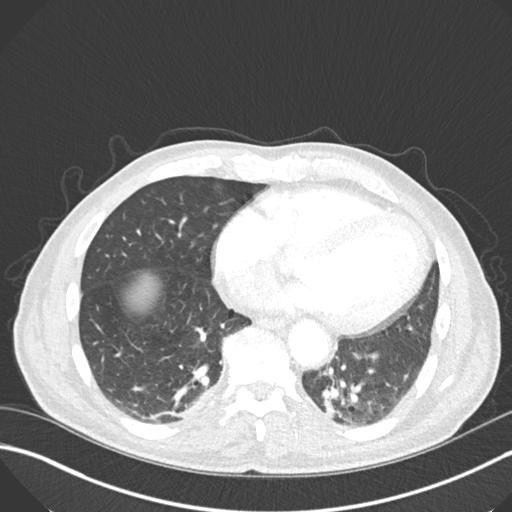
[im 73/165  lung]
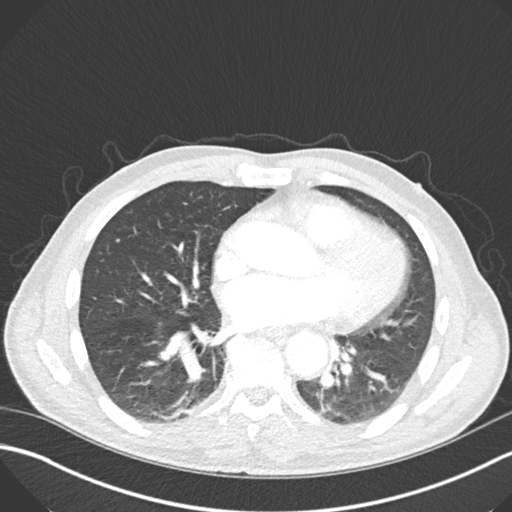
[im 92/165  lung]
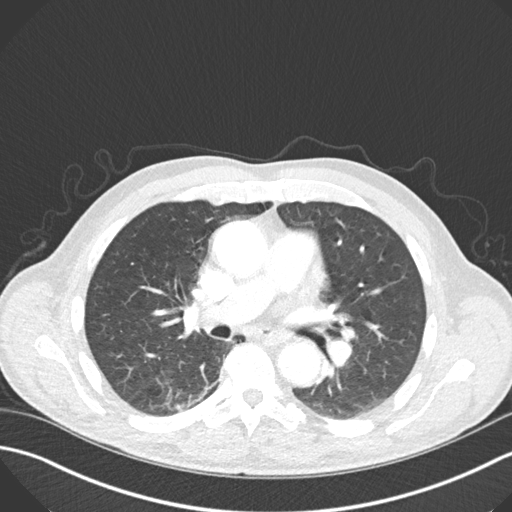
[im 104/165  lung]
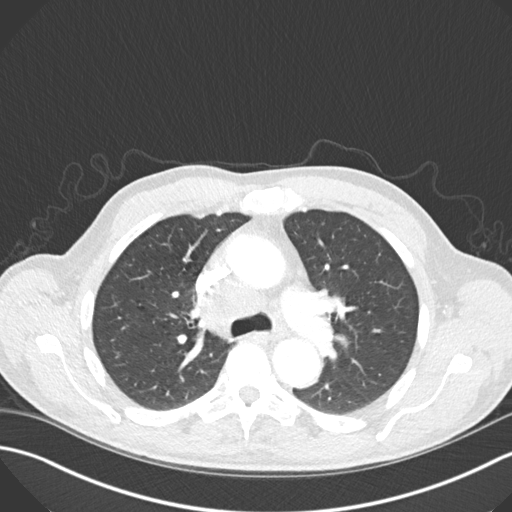
[im 116/165  mediastinal]
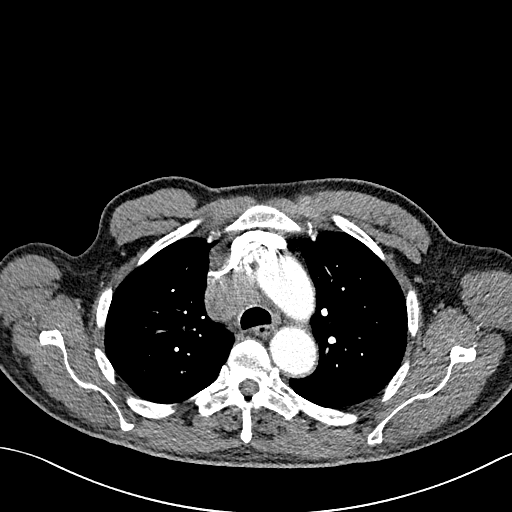
[im 116/165  lung]
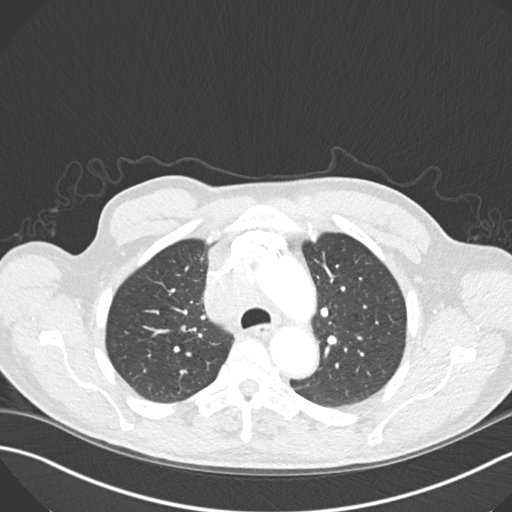
[im 128/165  lung]
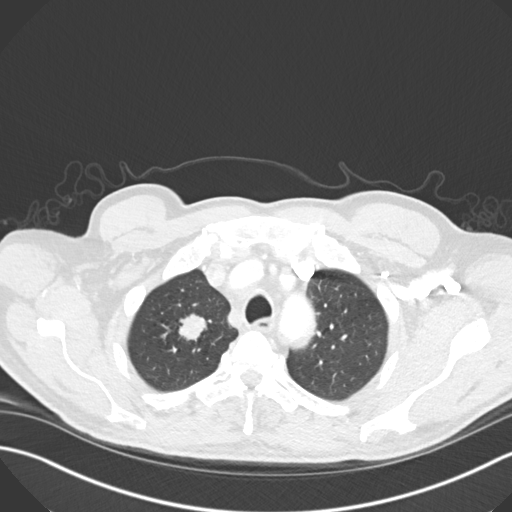
[im 140/165  lung]
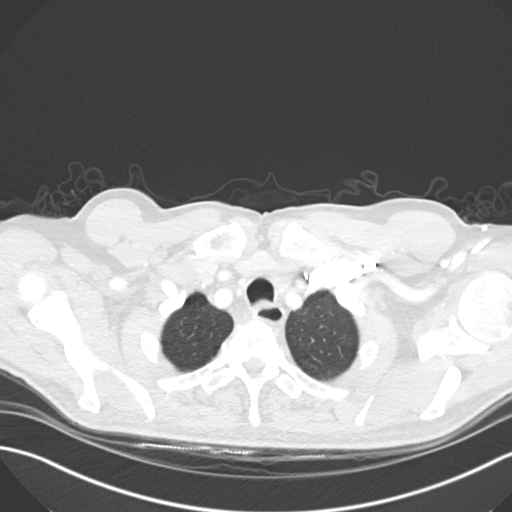
[im 152/165  lung]
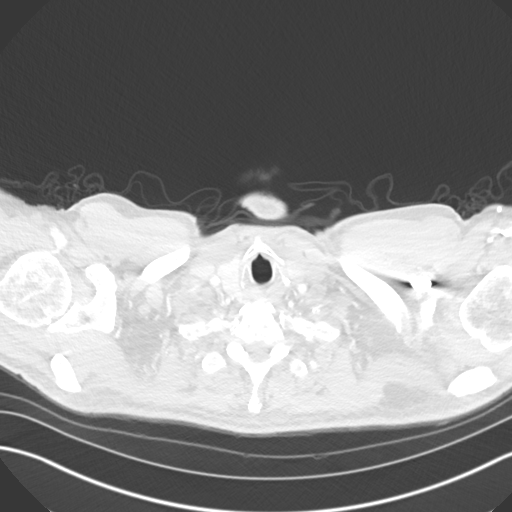

[Series 5: coronal · coronal · 0.64mm/px · 3 of 113 slices shown]
[im 23/113  lung]
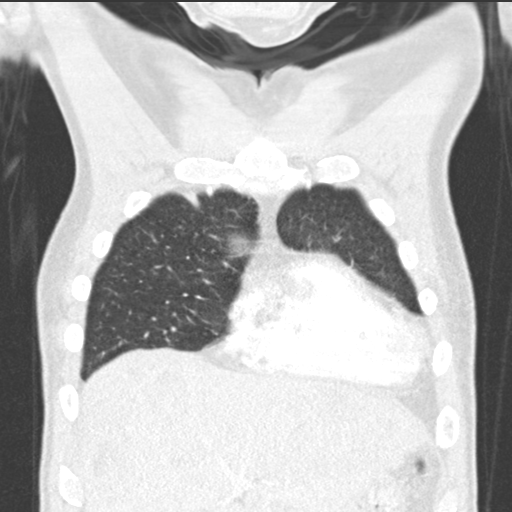
[im 45/113  lung]
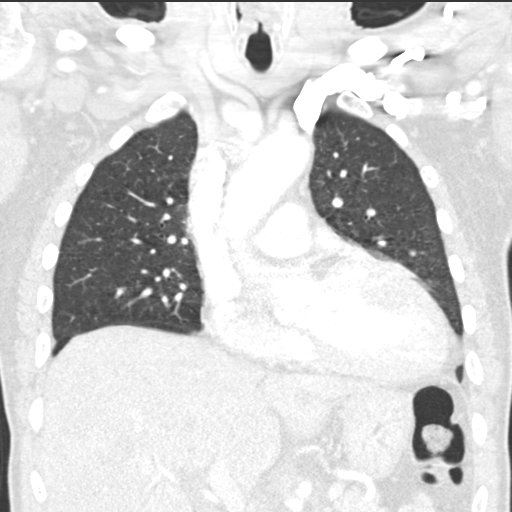
[im 68/113  lung]
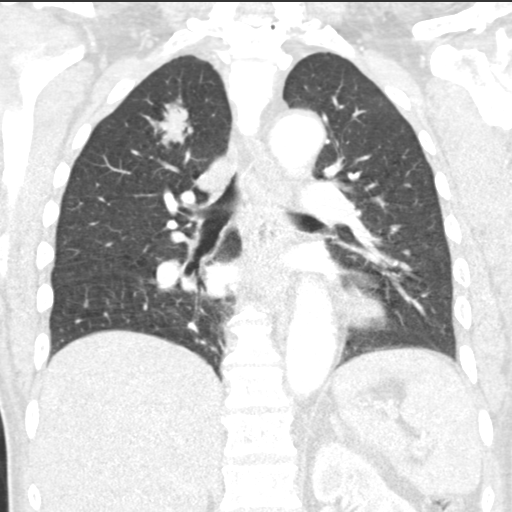

[15 of 36 positions shown; findings below may reference images not displayed]

FINDINGS: Cardiovascular: Top-normal heart size. No significant pericardial
effusion/thickening. Ectatic 4.0 cm ascending thoracic aorta. Normal
caliber pulmonary arteries. No central pulmonary emboli.

Mediastinum/Nodes: Suspected hypodense 1.7 cm left thyroid lobe
nodule, poorly delineated on this scan due to streak artifact from
dense left subclavian vein iodinated contrast. Unremarkable
esophagus. No axillary adenopathy. Enlarged 1.4 cm right
supraclavicular node (series 2/image 17). Enlarged 2.0 cm
prevascular right mediastinal node with mass-effect on the left
brachiocephalic vein (series 2/image 53). Multiple enlarged right
paratracheal nodes, largest 3.0 cm (series 2/image 60). No hilar
adenopathy.

Lungs/Pleura: No pneumothorax. No pleural effusion. Irregular solid
right upper lobe lung mass measuring 2.2 x 2.0 x 3.9 cm (series
3/image 30). Hypoventilatory changes in dependent lower lobes. No
additional significant pulmonary nodules. Mild centrilobular
emphysema.

Upper abdomen: Redemonstration innumerable low-attenuation liver
masses scattered throughout the visualized liver, better delineated
on the recent CT abdomen study. Stable 2.2 cm left adrenal mass.

Musculoskeletal: Innumerable small lytic osseous lesions throughout
the thoracic skeleton, most prominent in thoracic vertebral bodies,
for example at T6 (series 6/image 80). Mild thoracic spondylosis.
IMPRESSION: 1. Irregular solid right upper lobe 2.2 x 2.0 x 3.9 cm lung mass,
most compatible with primary bronchogenic carcinoma.
2. Bulky right paratracheal and right prevascular mediastinal
adenopathy. Mild right supraclavicular adenopathy.
3. Innumerable small lytic osseous lesions throughout the thoracic
skeleton suspicious for widespread osseous metastatic disease.
4. Redemonstration of innumerable liver metastases and left adrenal
mass suspicious for adrenal metastasis.
5. Ectatic 4.0 cm ascending thoracic aorta. Recommend annual imaging
followup by CTA or MRA. This recommendation follows 2404
ACCF/AHA/AATS/ACR/ASA/SCA/KAAFU INN/CLAPPER/OX/SVANAUG Guidelines for the
Diagnosis and Management of Patients with Thoracic Aortic Disease.
Circulation. 2404; 121: [id number].

Aortic Atherosclerosis (G5YO3-YVW.W) and Emphysema (G5YO3-HLS.7).

These results will be called to the ordering clinician or
representative by the [HOSPITAL] at the imaging location.

## 2018-09-30 IMAGING — US US BIOPSY LYMPH NODE
1 series · 13 of 15 positions shown · non-contrast
Comparison: none

INDICATION: 71-year-old with evidence for metastatic cancer and needs a tissue
diagnosis. Patient has enlarged right supraclavicular lymph node on
recent CT.

[Series 1: us biopsy lymph node · 13 of 15 slices shown]
[im 1/15]
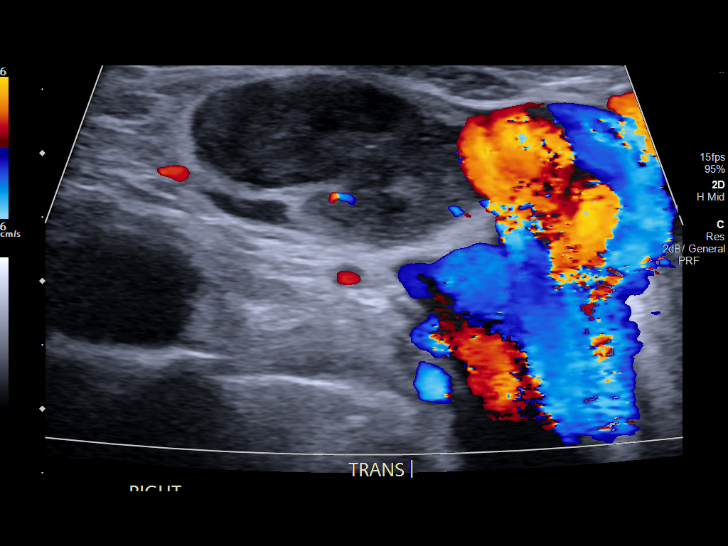
[im 2/15]
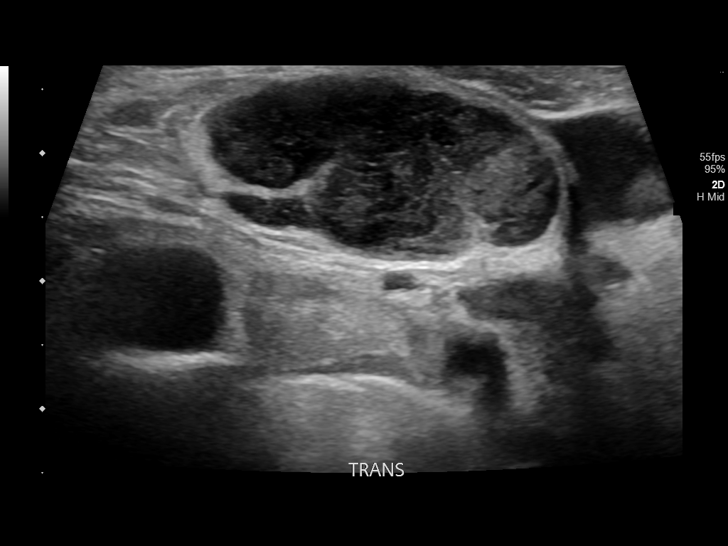
[im 3/15]
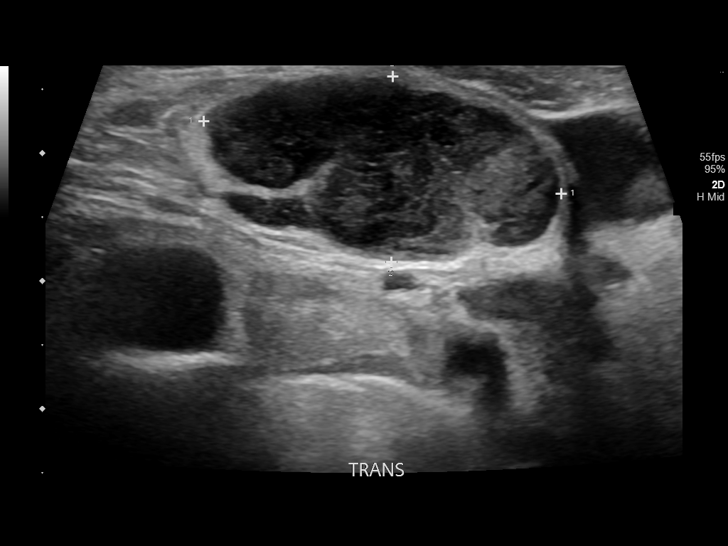
[im 5/15]
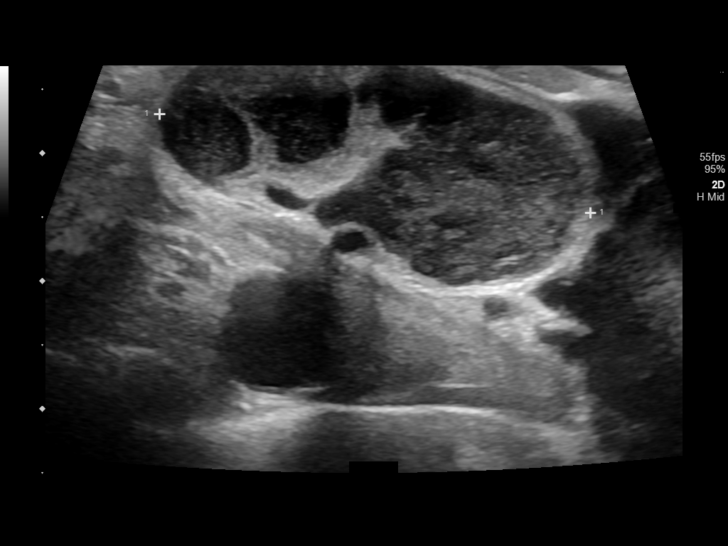
[im 6/15]
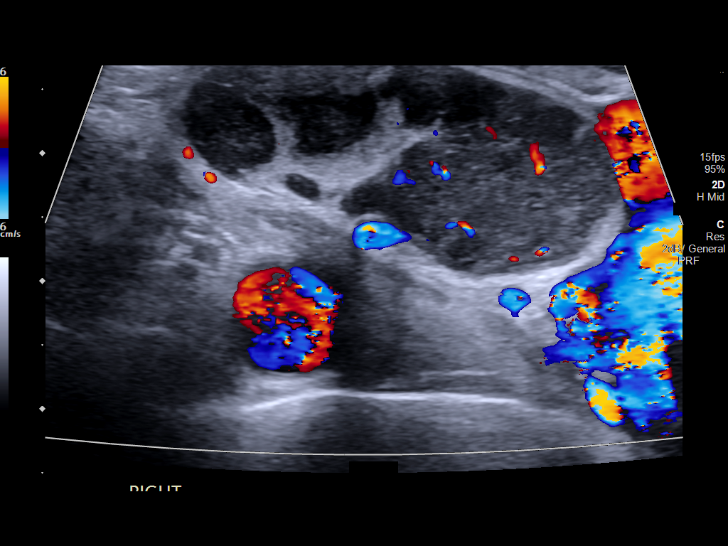
[im 7/15]
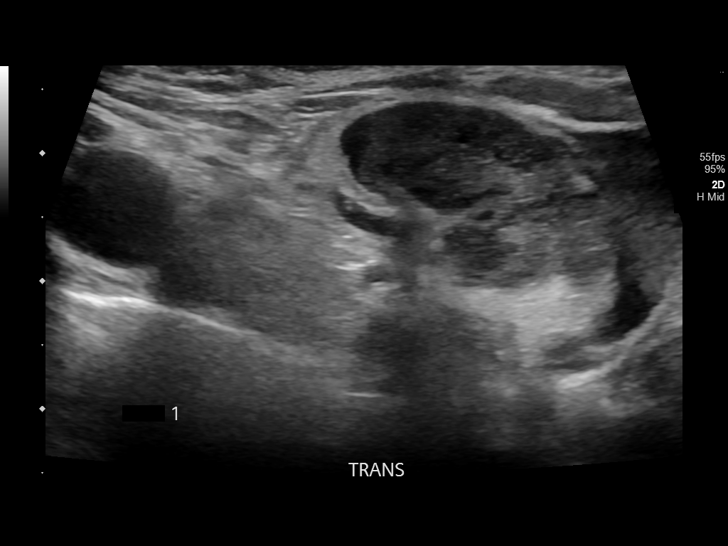
[im 8/15]
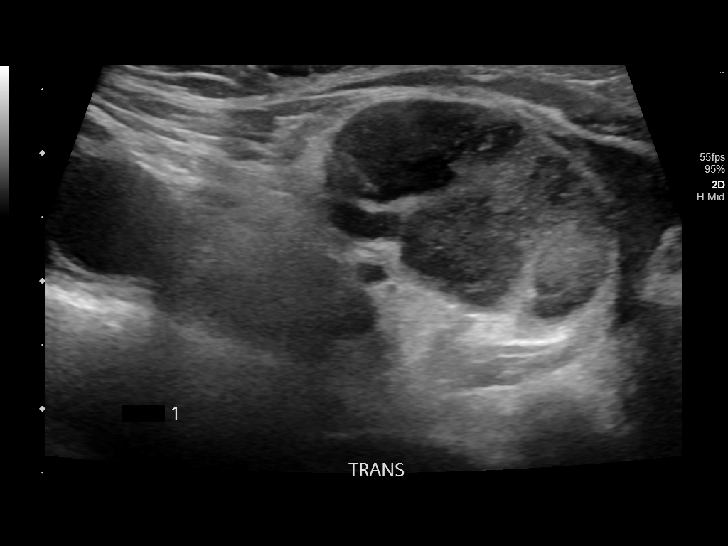
[im 9/15]
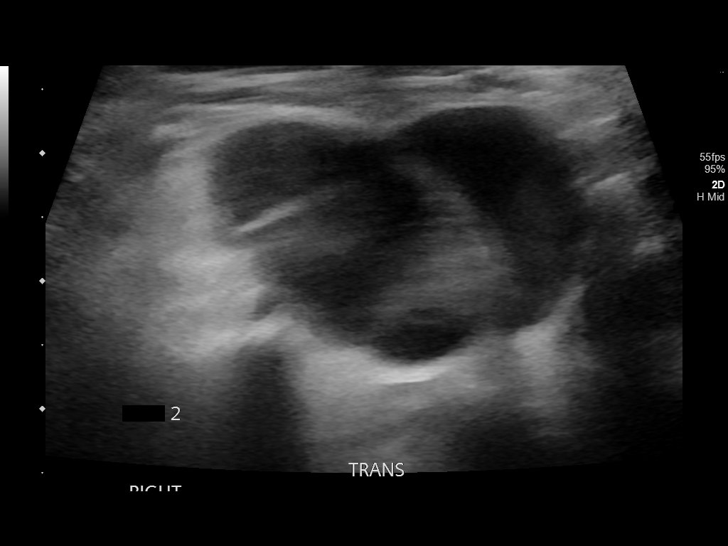
[im 10/15]
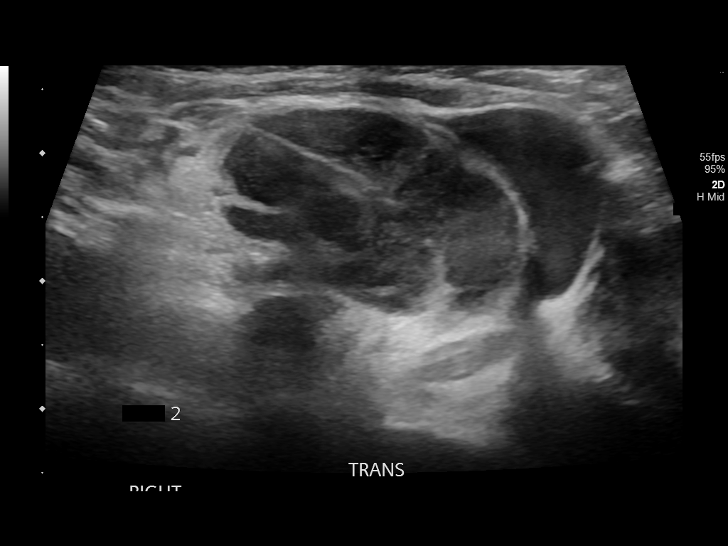
[im 11/15]
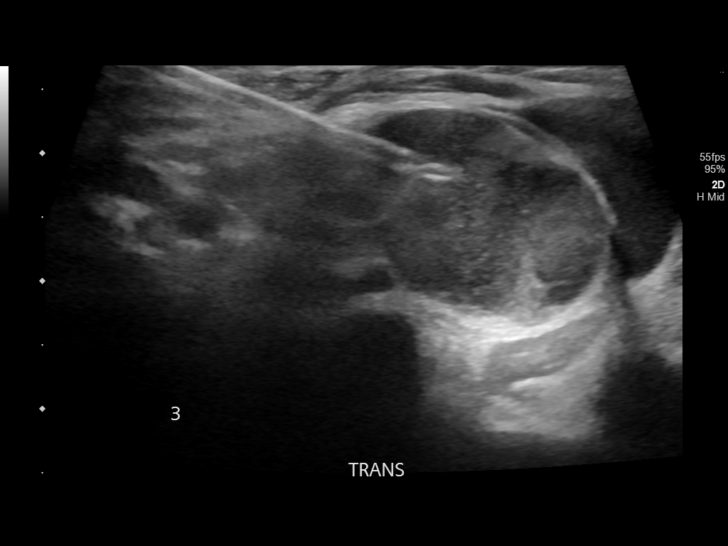
[im 13/15]
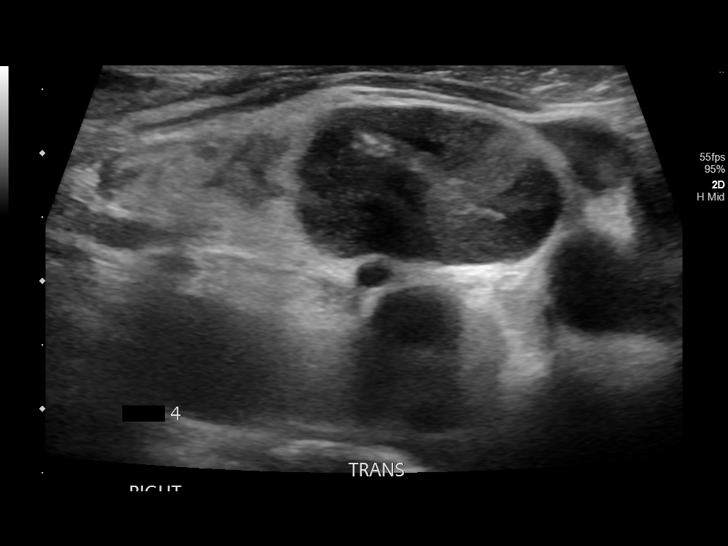
[im 14/15]
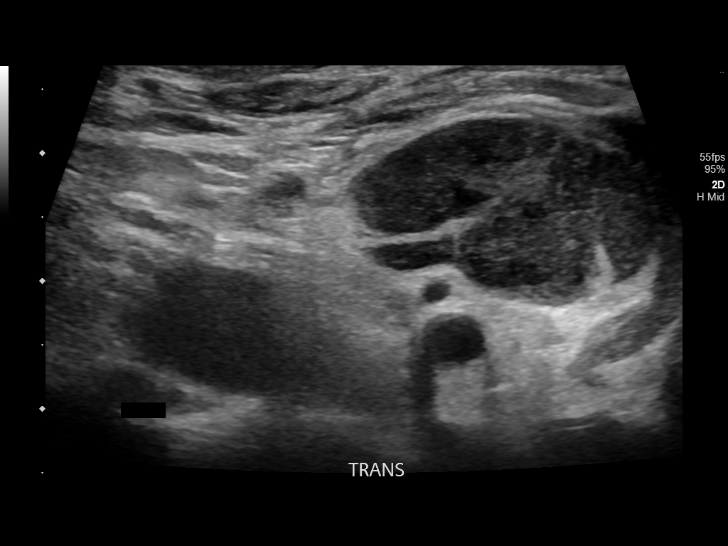
[im 15/15]
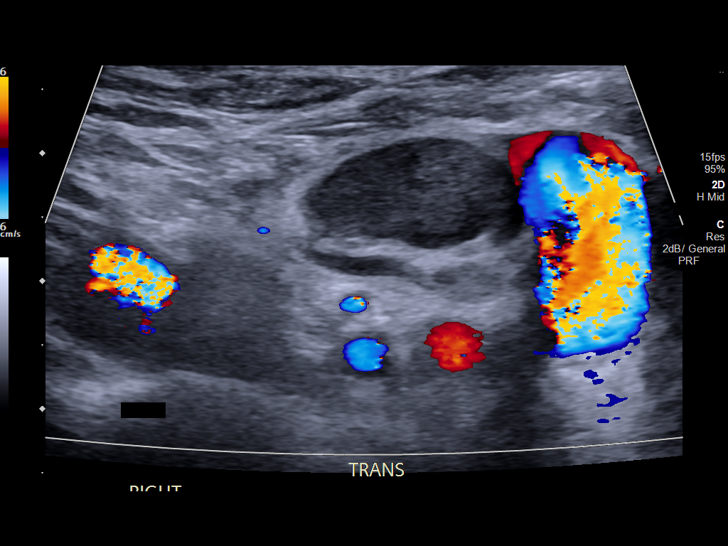

[13 of 15 positions shown; findings below may reference images not displayed]

EXAM:
ULTRASOUND-GUIDED CORE BIOPSY OF RIGHT SUPRACLAVICULAR LYMPH NODE

MEDICATIONS:
None.

ANESTHESIA/SEDATION:
None

FLUOROSCOPY TIME:  None

COMPLICATIONS:
None immediate.

PROCEDURE:
Patient is confused and informed consent was obtained from the
patient's wife.

The right side of the neck was evaluated with ultrasound. Enlarged
right supraclavicular lymph nodes were identified. Right side of the
neck was prepped with chlorhexidine and sterile field was created.
Skin and soft tissues were anesthetized with 1% lidocaine. Using
ultrasound guidance, 18 gauge core needle was directed into the
enlarged supraclavicular lymph node. A total of 4 core biopsies were
obtained. Specimens placed in formalin. Bandage placed over the
puncture site.
FINDINGS: Enlarged right supraclavicular lymph nodes adjacent to the right
internal jugular vein. Largest lymph node measures up to 3.5 cm. No
significant bleeding or hematoma formation following the core
biopsies.
IMPRESSION: Ultrasound-guided core biopsy of an enlarged right supraclavicular
lymph node.
